# Patient Record
Sex: Male | Born: 1945 | Race: White | Hispanic: No | Marital: Married | Smoking: Never smoker
Health system: Southern US, Community
[De-identification: ages and names within clinical notes are randomized; demographics above are authoritative.]

## PROBLEM LIST (undated history)

## (undated) DIAGNOSIS — R011 Cardiac murmur, unspecified: Secondary | ICD-10-CM

## (undated) DIAGNOSIS — I10 Essential (primary) hypertension: Secondary | ICD-10-CM

## (undated) DIAGNOSIS — I429 Cardiomyopathy, unspecified: Secondary | ICD-10-CM

## (undated) DIAGNOSIS — M1711 Unilateral primary osteoarthritis, right knee: Secondary | ICD-10-CM

## (undated) DIAGNOSIS — I340 Nonrheumatic mitral (valve) insufficiency: Secondary | ICD-10-CM

## (undated) DIAGNOSIS — Z87442 Personal history of urinary calculi: Secondary | ICD-10-CM

## (undated) DIAGNOSIS — K4090 Unilateral inguinal hernia, without obstruction or gangrene, not specified as recurrent: Secondary | ICD-10-CM

## (undated) DIAGNOSIS — M1990 Unspecified osteoarthritis, unspecified site: Secondary | ICD-10-CM

## (undated) DIAGNOSIS — E785 Hyperlipidemia, unspecified: Secondary | ICD-10-CM

## (undated) DIAGNOSIS — R0600 Dyspnea, unspecified: Secondary | ICD-10-CM

## (undated) DIAGNOSIS — I517 Cardiomegaly: Secondary | ICD-10-CM

## (undated) DIAGNOSIS — I499 Cardiac arrhythmia, unspecified: Secondary | ICD-10-CM

## (undated) DIAGNOSIS — I4891 Unspecified atrial fibrillation: Secondary | ICD-10-CM

## (undated) DIAGNOSIS — IMO0001 Reserved for inherently not codable concepts without codable children: Secondary | ICD-10-CM

## (undated) DIAGNOSIS — C61 Malignant neoplasm of prostate: Secondary | ICD-10-CM

## (undated) DIAGNOSIS — I351 Nonrheumatic aortic (valve) insufficiency: Secondary | ICD-10-CM

## (undated) HISTORY — PX: COLONOSCOPY: SHX174

## (undated) HISTORY — PX: LITHOTRIPSY: SUR834

## (undated) HISTORY — PX: APPENDECTOMY: SHX54

## (undated) HISTORY — PX: INGUINAL HERNIA REPAIR: SUR1180

## (undated) HISTORY — PX: JOINT REPLACEMENT: SHX530

---
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
## 2000-08-21 HISTORY — PX: PROSTATE SURGERY: SHX751

## 2015-10-05 DIAGNOSIS — E785 Hyperlipidemia, unspecified: Secondary | ICD-10-CM | POA: Diagnosis not present

## 2015-10-05 DIAGNOSIS — Z1211 Encounter for screening for malignant neoplasm of colon: Secondary | ICD-10-CM | POA: Diagnosis not present

## 2015-10-05 DIAGNOSIS — R222 Localized swelling, mass and lump, trunk: Secondary | ICD-10-CM | POA: Diagnosis not present

## 2015-10-05 DIAGNOSIS — Z0000 Encounter for general adult medical examination without abnormal findings: Secondary | ICD-10-CM | POA: Diagnosis not present

## 2015-10-05 DIAGNOSIS — I10 Essential (primary) hypertension: Secondary | ICD-10-CM | POA: Diagnosis not present

## 2015-10-05 DIAGNOSIS — Z23 Encounter for immunization: Secondary | ICD-10-CM | POA: Diagnosis not present

## 2015-10-06 ENCOUNTER — Other Ambulatory Visit: Payer: Self-pay | Admitting: Family Medicine

## 2015-10-06 DIAGNOSIS — R222 Localized swelling, mass and lump, trunk: Secondary | ICD-10-CM

## 2015-10-18 ENCOUNTER — Ambulatory Visit
Admission: RE | Admit: 2015-10-18 | Discharge: 2015-10-18 | Disposition: A | Discharge status: Home / Self Care | Payer: Self-pay | Source: Ambulatory Visit | Attending: Family Medicine | Admitting: Family Medicine

## 2015-10-18 DIAGNOSIS — R221 Localized swelling, mass and lump, neck: Secondary | ICD-10-CM | POA: Diagnosis not present

## 2015-10-18 DIAGNOSIS — R222 Localized swelling, mass and lump, trunk: Secondary | ICD-10-CM

## 2015-10-20 DIAGNOSIS — Z1211 Encounter for screening for malignant neoplasm of colon: Secondary | ICD-10-CM | POA: Diagnosis not present

## 2015-11-22 DIAGNOSIS — D126 Benign neoplasm of colon, unspecified: Secondary | ICD-10-CM | POA: Diagnosis not present

## 2015-11-22 DIAGNOSIS — D125 Benign neoplasm of sigmoid colon: Secondary | ICD-10-CM | POA: Diagnosis not present

## 2015-11-22 DIAGNOSIS — Z1211 Encounter for screening for malignant neoplasm of colon: Secondary | ICD-10-CM | POA: Diagnosis not present

## 2015-11-22 DIAGNOSIS — K640 First degree hemorrhoids: Secondary | ICD-10-CM | POA: Diagnosis not present

## 2015-11-22 DIAGNOSIS — K621 Rectal polyp: Secondary | ICD-10-CM | POA: Diagnosis not present

## 2015-11-22 DIAGNOSIS — K5730 Diverticulosis of large intestine without perforation or abscess without bleeding: Secondary | ICD-10-CM | POA: Diagnosis not present

## 2015-12-07 DIAGNOSIS — M7541 Impingement syndrome of right shoulder: Secondary | ICD-10-CM | POA: Diagnosis not present

## 2015-12-07 DIAGNOSIS — M25511 Pain in right shoulder: Secondary | ICD-10-CM | POA: Diagnosis not present

## 2015-12-07 DIAGNOSIS — M19011 Primary osteoarthritis, right shoulder: Secondary | ICD-10-CM | POA: Diagnosis not present

## 2016-01-06 DIAGNOSIS — K1329 Other disturbances of oral epithelium, including tongue: Secondary | ICD-10-CM | POA: Diagnosis not present

## 2016-01-12 ENCOUNTER — Encounter (HOSPITAL_COMMUNITY): Payer: Self-pay | Admitting: *Deleted

## 2016-01-12 ENCOUNTER — Other Ambulatory Visit (HOSPITAL_COMMUNITY): Payer: Self-pay | Admitting: *Deleted

## 2016-01-12 MED ORDER — TRANEXAMIC ACID 1000 MG/10ML IV SOLN
1000.0000 mg | INTRAVENOUS | Status: AC
  Administered 2016-01-13: 1000 mg via INTRAVENOUS
  Filled 2016-01-12 (×2): qty 10

## 2016-01-12 NOTE — Progress Notes (Signed)
   01/12/16 1458  OBSTRUCTIVE SLEEP APNEA  Have you ever been diagnosed with sleep apnea through a sleep study? No  Do you snore loudly (loud enough to be heard through closed doors)?  1  Do you often feel tired, fatigued, or sleepy during the daytime (such as falling asleep during driving or talking to someone)? 0  Has anyone observed you stop breathing during your sleep? 1  Do you have, or are you being treated for high blood pressure? 1  Age > 88 (1-yes) 1  Male Gender (Yes=1) 1  Obstructive Sleep Apnea Score 5  Score 5 or greater  Results sent to PCP

## 2016-01-13 ENCOUNTER — Encounter (HOSPITAL_COMMUNITY)
Admission: RE | Disposition: A | Discharge status: Home / Self Care | Payer: Self-pay | Source: Ambulatory Visit | Attending: Orthopedic Surgery

## 2016-01-13 ENCOUNTER — Inpatient Hospital Stay (HOSPITAL_COMMUNITY): Payer: Medicare Other | Admitting: Anesthesiology

## 2016-01-13 ENCOUNTER — Encounter (HOSPITAL_COMMUNITY): Payer: Self-pay | Admitting: *Deleted

## 2016-01-13 ENCOUNTER — Inpatient Hospital Stay (HOSPITAL_COMMUNITY)
Admission: RE | Admit: 2016-01-13 | Discharge: 2016-01-14 | DRG: 483 | Disposition: A | Discharge status: Home / Self Care | Payer: Medicare Other | Source: Ambulatory Visit | Attending: Orthopedic Surgery | Admitting: Orthopedic Surgery

## 2016-01-13 DIAGNOSIS — M19011 Primary osteoarthritis, right shoulder: Secondary | ICD-10-CM | POA: Diagnosis not present

## 2016-01-13 DIAGNOSIS — Z8546 Personal history of malignant neoplasm of prostate: Secondary | ICD-10-CM

## 2016-01-13 DIAGNOSIS — I10 Essential (primary) hypertension: Secondary | ICD-10-CM | POA: Diagnosis present

## 2016-01-13 DIAGNOSIS — Z96619 Presence of unspecified artificial shoulder joint: Secondary | ICD-10-CM

## 2016-01-13 DIAGNOSIS — Z96611 Presence of right artificial shoulder joint: Secondary | ICD-10-CM

## 2016-01-13 DIAGNOSIS — Z96652 Presence of left artificial knee joint: Secondary | ICD-10-CM | POA: Diagnosis not present

## 2016-01-13 DIAGNOSIS — M75101 Unspecified rotator cuff tear or rupture of right shoulder, not specified as traumatic: Secondary | ICD-10-CM | POA: Diagnosis not present

## 2016-01-13 DIAGNOSIS — G8918 Other acute postprocedural pain: Secondary | ICD-10-CM | POA: Diagnosis not present

## 2016-01-13 HISTORY — PX: REVERSE SHOULDER ARTHROPLASTY: SHX5054

## 2016-01-13 HISTORY — DX: Unspecified osteoarthritis, unspecified site: M19.90

## 2016-01-13 HISTORY — DX: Essential (primary) hypertension: I10

## 2016-01-13 LAB — CBC
HCT: 45.2 % (ref 39.0–52.0)
Hemoglobin: 14.6 g/dL (ref 13.0–17.0)
MCH: 29.4 pg (ref 26.0–34.0)
MCHC: 32.3 g/dL (ref 30.0–36.0)
MCV: 91.1 fL (ref 78.0–100.0)
PLATELETS: 195 10*3/uL (ref 150–400)
RBC: 4.96 MIL/uL (ref 4.22–5.81)
RDW: 13.7 % (ref 11.5–15.5)
WBC: 5.8 10*3/uL (ref 4.0–10.5)

## 2016-01-13 LAB — BASIC METABOLIC PANEL
Anion gap: 8 (ref 5–15)
BUN: 26 mg/dL — AB (ref 6–20)
CHLORIDE: 107 mmol/L (ref 101–111)
CO2: 24 mmol/L (ref 22–32)
CREATININE: 1.09 mg/dL (ref 0.61–1.24)
Calcium: 9.2 mg/dL (ref 8.9–10.3)
GFR calc Af Amer: >60 mL/min (ref >60)
GLUCOSE: 109 mg/dL — AB (ref 65–99)
Potassium: 4 mmol/L (ref 3.5–5.1)
SODIUM: 139 mmol/L (ref 135–145)

## 2016-01-13 SURGERY — ARTHROPLASTY, SHOULDER, TOTAL, REVERSE
Anesthesia: General | Site: Shoulder | Laterality: Right

## 2016-01-13 MED ORDER — PHENOL 1.4 % MT LIQD: 1.0000 | OROMUCOSAL | Status: DC | PRN

## 2016-01-13 MED ORDER — ARTIFICIAL TEARS OP OINT
TOPICAL_OINTMENT | OPHTHALMIC | Status: DC | PRN
  Administered 2016-01-13: 1 via OPHTHALMIC

## 2016-01-13 MED ORDER — SUGAMMADEX SODIUM 200 MG/2ML IV SOLN
INTRAVENOUS | Status: DC | PRN
  Administered 2016-01-13: 200 mg via INTRAVENOUS

## 2016-01-13 MED ORDER — PHENYLEPHRINE 40 MCG/ML (10ML) SYRINGE FOR IV PUSH (FOR BLOOD PRESSURE SUPPORT)
PREFILLED_SYRINGE | INTRAVENOUS | Status: AC
  Filled 2016-01-13: qty 10

## 2016-01-13 MED ORDER — LIDOCAINE 2% (20 MG/ML) 5 ML SYRINGE
INTRAMUSCULAR | Status: AC
  Filled 2016-01-13: qty 5

## 2016-01-13 MED ORDER — ONDANSETRON HCL 4 MG PO TABS: 4.0000 mg | ORAL_TABLET | Freq: Four times a day (QID) | ORAL | Status: DC | PRN

## 2016-01-13 MED ORDER — ACETAMINOPHEN 650 MG RE SUPP: 650.0000 mg | Freq: Four times a day (QID) | RECTAL | Status: DC | PRN

## 2016-01-13 MED ORDER — ONDANSETRON HCL 4 MG/2ML IJ SOLN
INTRAMUSCULAR | Status: AC
  Filled 2016-01-13: qty 2

## 2016-01-13 MED ORDER — DOCUSATE SODIUM 100 MG PO CAPS
100.0000 mg | ORAL_CAPSULE | Freq: Two times a day (BID) | ORAL | Status: DC
  Administered 2016-01-14: 100 mg via ORAL
  Filled 2016-01-13: qty 1

## 2016-01-13 MED ORDER — METOCLOPRAMIDE HCL 5 MG/ML IJ SOLN: 5.0000 mg | Freq: Three times a day (TID) | INTRAMUSCULAR | Status: DC | PRN

## 2016-01-13 MED ORDER — BISACODYL 5 MG PO TBEC: 5.0000 mg | DELAYED_RELEASE_TABLET | Freq: Every day | ORAL | Status: DC | PRN

## 2016-01-13 MED ORDER — DEXTROSE 5 % IV SOLN
10.0000 mg | INTRAVENOUS | Status: DC | PRN
  Administered 2016-01-13: 100 ug/min via INTRAVENOUS

## 2016-01-13 MED ORDER — ROCURONIUM BROMIDE 50 MG/5ML IV SOLN
INTRAVENOUS | Status: AC
  Filled 2016-01-13: qty 1

## 2016-01-13 MED ORDER — CEFAZOLIN SODIUM-DEXTROSE 2-4 GM/100ML-% IV SOLN
2.0000 g | INTRAVENOUS | Status: AC
  Administered 2016-01-13: 2 g via INTRAVENOUS
  Filled 2016-01-13: qty 100

## 2016-01-13 MED ORDER — MIDAZOLAM HCL 2 MG/2ML IJ SOLN
INTRAMUSCULAR | Status: AC
  Administered 2016-01-13: 1 mg
  Filled 2016-01-13: qty 2

## 2016-01-13 MED ORDER — FENTANYL CITRATE (PF) 100 MCG/2ML IJ SOLN
INTRAMUSCULAR | Status: DC | PRN
  Administered 2016-01-13 (×2): 100 ug via INTRAVENOUS

## 2016-01-13 MED ORDER — LACTATED RINGERS IV SOLN
INTRAVENOUS | Status: DC | PRN
  Administered 2016-01-13 (×2): via INTRAVENOUS

## 2016-01-13 MED ORDER — LIDOCAINE HCL (CARDIAC) 20 MG/ML IV SOLN
INTRAVENOUS | Status: DC | PRN
  Administered 2016-01-13: 60 mg via INTRAVENOUS

## 2016-01-13 MED ORDER — 0.9 % SODIUM CHLORIDE (POUR BTL) OPTIME
TOPICAL | Status: DC | PRN
  Administered 2016-01-13: 1000 mL

## 2016-01-13 MED ORDER — HYDROMORPHONE HCL 1 MG/ML IJ SOLN: 1.0000 mg | INTRAMUSCULAR | Status: DC | PRN

## 2016-01-13 MED ORDER — GLYCOPYRROLATE 0.2 MG/ML IJ SOLN
INTRAMUSCULAR | Status: DC | PRN
  Administered 2016-01-13 (×2): 0.2 mg via INTRAVENOUS
  Administered 2016-01-13: .2 mg via INTRAVENOUS

## 2016-01-13 MED ORDER — OXYCODONE HCL 5 MG PO TABS
5.0000 mg | ORAL_TABLET | ORAL | Status: DC | PRN
  Administered 2016-01-14 (×3): 10 mg via ORAL
  Filled 2016-01-13 (×3): qty 2

## 2016-01-13 MED ORDER — MENTHOL 3 MG MT LOZG: 1.0000 | LOZENGE | OROMUCOSAL | Status: DC | PRN

## 2016-01-13 MED ORDER — ARTIFICIAL TEARS OP OINT
TOPICAL_OINTMENT | OPHTHALMIC | Status: AC
  Filled 2016-01-13: qty 3.5

## 2016-01-13 MED ORDER — PHENYLEPHRINE HCL 10 MG/ML IJ SOLN
INTRAMUSCULAR | Status: DC | PRN
  Administered 2016-01-13: 80 ug via INTRAVENOUS
  Administered 2016-01-13: 120 ug via INTRAVENOUS
  Administered 2016-01-13: 80 ug via INTRAVENOUS
  Administered 2016-01-13: 120 ug via INTRAVENOUS

## 2016-01-13 MED ORDER — CHLORHEXIDINE GLUCONATE 4 % EX LIQD: 60.0000 mL | Freq: Once | CUTANEOUS | Status: DC

## 2016-01-13 MED ORDER — PROPOFOL 10 MG/ML IV BOLUS
INTRAVENOUS | Status: DC | PRN
  Administered 2016-01-13: 200 mg via INTRAVENOUS

## 2016-01-13 MED ORDER — ONDANSETRON HCL 4 MG/2ML IJ SOLN: 4.0000 mg | Freq: Four times a day (QID) | INTRAMUSCULAR | Status: DC | PRN

## 2016-01-13 MED ORDER — ONDANSETRON HCL 4 MG/2ML IJ SOLN
INTRAMUSCULAR | Status: DC | PRN
  Administered 2016-01-13: 4 mg via INTRAVENOUS

## 2016-01-13 MED ORDER — ALUM & MAG HYDROXIDE-SIMETH 200-200-20 MG/5ML PO SUSP: 30.0000 mL | ORAL | Status: DC | PRN

## 2016-01-13 MED ORDER — METOCLOPRAMIDE HCL 5 MG PO TABS: 5.0000 mg | ORAL_TABLET | Freq: Three times a day (TID) | ORAL | Status: DC | PRN

## 2016-01-13 MED ORDER — FENTANYL CITRATE (PF) 100 MCG/2ML IJ SOLN
INTRAMUSCULAR | Status: AC
  Administered 2016-01-13: 50 ug
  Filled 2016-01-13: qty 2

## 2016-01-13 MED ORDER — ROCURONIUM BROMIDE 100 MG/10ML IV SOLN
INTRAVENOUS | Status: DC | PRN
Start: 2016-01-13 — End: 2016-01-13
  Administered 2016-01-13: 50 mg via INTRAVENOUS

## 2016-01-13 MED ORDER — PROPOFOL 10 MG/ML IV BOLUS
INTRAVENOUS | Status: AC
  Filled 2016-01-13: qty 20

## 2016-01-13 MED ORDER — LACTATED RINGERS IV SOLN
INTRAVENOUS | Status: DC
  Administered 2016-01-13: 75 mL via INTRAVENOUS

## 2016-01-13 MED ORDER — MAGNESIUM CITRATE PO SOLN: 1.0000 | Freq: Once | ORAL | Status: DC | PRN

## 2016-01-13 MED ORDER — FENTANYL CITRATE (PF) 250 MCG/5ML IJ SOLN
INTRAMUSCULAR | Status: AC
  Filled 2016-01-13: qty 5

## 2016-01-13 MED ORDER — BUPIVACAINE-EPINEPHRINE (PF) 0.5% -1:200000 IJ SOLN
INTRAMUSCULAR | Status: DC | PRN
  Administered 2016-01-13: 25 mL via PERINEURAL

## 2016-01-13 MED ORDER — DIAZEPAM 2 MG PO TABS: 2.0000 mg | ORAL_TABLET | Freq: Four times a day (QID) | ORAL | Status: DC | PRN

## 2016-01-13 MED ORDER — GLYCOPYRROLATE 0.2 MG/ML IV SOSY
PREFILLED_SYRINGE | INTRAVENOUS | Status: AC
  Filled 2016-01-13: qty 3

## 2016-01-13 MED ORDER — ACETAMINOPHEN 325 MG PO TABS: 650.0000 mg | ORAL_TABLET | Freq: Four times a day (QID) | ORAL | Status: DC | PRN

## 2016-01-13 MED ORDER — CEFAZOLIN SODIUM-DEXTROSE 2-4 GM/100ML-% IV SOLN
2.0000 g | Freq: Four times a day (QID) | INTRAVENOUS | Status: AC
  Administered 2016-01-13 – 2016-01-14 (×3): 2 g via INTRAVENOUS
  Filled 2016-01-13 (×3): qty 100

## 2016-01-13 MED ORDER — BISOPROLOL-HYDROCHLOROTHIAZIDE 10-6.25 MG PO TABS
1.0000 | ORAL_TABLET | ORAL | Status: DC
  Administered 2016-01-14: 1 via ORAL
  Filled 2016-01-13: qty 1

## 2016-01-13 MED ORDER — LISINOPRIL 20 MG PO TABS
30.0000 mg | ORAL_TABLET | ORAL | Status: DC
  Administered 2016-01-14: 30 mg via ORAL
  Filled 2016-01-13: qty 1

## 2016-01-13 MED ORDER — POLYETHYLENE GLYCOL 3350 17 G PO PACK
17.0000 g | PACK | Freq: Every day | ORAL | Status: DC | PRN
Start: 2016-01-13 — End: 2016-01-14

## 2016-01-13 SURGICAL SUPPLY — 69 items
BASEPLATE GLENOID SHLDR SM (Shoulder) ×2 IMPLANT
BLADE SAW SGTL 83.5X18.5 (BLADE) ×2 IMPLANT
COVER SURGICAL LIGHT HANDLE (MISCELLANEOUS) ×2 IMPLANT
CUP SUT UNIV REVERS 42 NEUT (Shoulder) ×2 IMPLANT
DERMABOND ADVANCED (GAUZE/BANDAGES/DRESSINGS) ×1
DERMABOND ADVANCED .7 DNX12 (GAUZE/BANDAGES/DRESSINGS) ×1 IMPLANT
DRAPE ORTHO SPLIT 77X108 STRL (DRAPES) ×2
DRAPE SURG 17X11 SM STRL (DRAPES) ×2 IMPLANT
DRAPE SURG ORHT 6 SPLT 77X108 (DRAPES) ×2 IMPLANT
DRAPE U-SHAPE 47X51 STRL (DRAPES) ×2 IMPLANT
DRSG AQUACEL AG ADV 3.5X10 (GAUZE/BANDAGES/DRESSINGS) ×2 IMPLANT
DURAPREP 26ML APPLICATOR (WOUND CARE) ×2 IMPLANT
ELECT BLADE 4.0 EZ CLEAN MEGAD (MISCELLANEOUS) ×2
ELECT CAUTERY BLADE 6.4 (BLADE) ×2 IMPLANT
ELECT REM PT RETURN 9FT ADLT (ELECTROSURGICAL) ×2
ELECTRODE BLDE 4.0 EZ CLN MEGD (MISCELLANEOUS) ×1 IMPLANT
ELECTRODE REM PT RTRN 9FT ADLT (ELECTROSURGICAL) ×1 IMPLANT
FACESHIELD WRAPAROUND (MASK) ×6 IMPLANT
GLENOSPHERE UNI REV 42+4 LAT (Shoulder) ×2 IMPLANT
GLOVE BIO SURGEON STRL SZ 6.5 (GLOVE) ×2 IMPLANT
GLOVE BIO SURGEON STRL SZ7.5 (GLOVE) ×2 IMPLANT
GLOVE BIO SURGEON STRL SZ8 (GLOVE) ×2 IMPLANT
GLOVE BIO SURGEON STRL SZ8.5 (GLOVE) ×2 IMPLANT
GLOVE BIOGEL PI IND STRL 7.0 (GLOVE) ×1 IMPLANT
GLOVE BIOGEL PI IND STRL 8.5 (GLOVE) ×1 IMPLANT
GLOVE BIOGEL PI INDICATOR 7.0 (GLOVE) ×1
GLOVE BIOGEL PI INDICATOR 8.5 (GLOVE) ×1
GLOVE EUDERMIC 7 POWDERFREE (GLOVE) ×4 IMPLANT
GLOVE SS BIOGEL STRL SZ 7.5 (GLOVE) ×1 IMPLANT
GLOVE SUPERSENSE BIOGEL SZ 7.5 (GLOVE) ×1
GOWN STRL REUS W/ TWL LRG LVL3 (GOWN DISPOSABLE) ×1 IMPLANT
GOWN STRL REUS W/ TWL XL LVL3 (GOWN DISPOSABLE) ×3 IMPLANT
GOWN STRL REUS W/TWL LRG LVL3 (GOWN DISPOSABLE) ×1
GOWN STRL REUS W/TWL XL LVL3 (GOWN DISPOSABLE) ×3
INSERT HUMERAL L/42+3 IN 42CUP (Shoulder) ×2 IMPLANT
KIT BASIN OR (CUSTOM PROCEDURE TRAY) ×2 IMPLANT
KIT ROOM TURNOVER OR (KITS) ×2 IMPLANT
MANIFOLD NEPTUNE II (INSTRUMENTS) ×2 IMPLANT
NEEDLE 1/2 CIR CATGUT .05X1.09 (NEEDLE) ×2 IMPLANT
NEEDLE HYPO 25GX1X1/2 BEV (NEEDLE) IMPLANT
NS IRRIG 1000ML POUR BTL (IV SOLUTION) ×2 IMPLANT
PACK SHOULDER (CUSTOM PROCEDURE TRAY) ×2 IMPLANT
PAD ARMBOARD 7.5X6 YLW CONV (MISCELLANEOUS) ×4 IMPLANT
PASSER SUT SWANSON 36MM LOOP (INSTRUMENTS) IMPLANT
RESTRAINT HEAD UNIVERSAL NS (MISCELLANEOUS) ×2 IMPLANT
SCREW CENTRAL NONLOCK 6.5X20MM (Shoulder) ×2 IMPLANT
SCREW LOCK PERIPHERAL 30MM (Shoulder) ×2 IMPLANT
SCREW LOCK PERIPHERAL 36MM (Screw) ×2 IMPLANT
SET PIN UNIVERSAL REVERSE (SET/KITS/TRAYS/PACK) ×2 IMPLANT
SLING ARM FOAM STRAP LRG (SOFTGOODS) IMPLANT
SLING S3 LATERAL DISP (MISCELLANEOUS) ×2 IMPLANT
SPONGE LAP 18X18 X RAY DECT (DISPOSABLE) ×2 IMPLANT
SPONGE LAP 4X18 X RAY DECT (DISPOSABLE) ×2 IMPLANT
STEM REVERS UNIVERS SZ7 CAP (Shoulder) ×2 IMPLANT
SUCTION FRAZIER HANDLE 10FR (MISCELLANEOUS) ×1
SUCTION TUBE FRAZIER 10FR DISP (MISCELLANEOUS) ×1 IMPLANT
SUT BONE WAX W31G (SUTURE) IMPLANT
SUT FIBERWIRE #2 38 T-5 BLUE (SUTURE) ×4
SUT MNCRL AB 3-0 PS2 18 (SUTURE) ×2 IMPLANT
SUT MON AB 2-0 CT1 36 (SUTURE) ×2 IMPLANT
SUT VIC AB 1 CT1 27 (SUTURE) ×1
SUT VIC AB 1 CT1 27XBRD ANBCTR (SUTURE) ×1 IMPLANT
SUT VIC AB 2-0 CT1 27 (SUTURE)
SUT VIC AB 2-0 CT1 TAPERPNT 27 (SUTURE) IMPLANT
SUTURE FIBERWR #2 38 T-5 BLUE (SUTURE) ×2 IMPLANT
SYR CONTROL 10ML LL (SYRINGE) IMPLANT
TOWEL OR 17X24 6PK STRL BLUE (TOWEL DISPOSABLE) ×2 IMPLANT
TOWEL OR 17X26 10 PK STRL BLUE (TOWEL DISPOSABLE) ×2 IMPLANT
WATER STERILE IRR 1000ML POUR (IV SOLUTION) ×2 IMPLANT

## 2016-01-13 NOTE — Anesthesia Preprocedure Evaluation (Addendum)
Anesthesia Evaluation  Patient identified by MRN, date of birth, ID band Patient awake    Reviewed: Allergy & Precautions, NPO status , Patient's Chart, lab work & pertinent test results  History of Anesthesia Complications Negative for: history of anesthetic complications  Airway Mallampati: I  TM Distance: >3 FB Neck ROM: Full    Dental  (+) Teeth Intact   Pulmonary neg pulmonary ROS,    breath sounds clear to auscultation       Cardiovascular hypertension,  Rhythm:Regular Rate:Normal     Neuro/Psych negative neurological ROS     GI/Hepatic negative GI ROS, Neg liver ROS,   Endo/Other  negative endocrine ROS  Renal/GU negative Renal ROS     Musculoskeletal  (+) Arthritis ,   Abdominal   Peds  Hematology negative hematology ROS (+)   Anesthesia Other Findings   Reproductive/Obstetrics                            Anesthesia Physical Anesthesia Plan  ASA: II  Anesthesia Plan: General   Post-op Pain Management: GA combined w/ Regional for post-op pain   Induction: Intravenous  Airway Management Planned: Oral ETT  Additional Equipment:   Intra-op Plan:   Post-operative Plan: Extubation in OR  Informed Consent: I have reviewed the patients History and Physical, chart, labs and discussed the procedure including the risks, benefits and alternatives for the proposed anesthesia with the patient or authorized representative who has indicated his/her understanding and acceptance.   Dental advisory given  Plan Discussed with:   Anesthesia Plan Comments:         Anesthesia Quick Evaluation

## 2016-01-13 NOTE — Op Note (Signed)
01/13/2016  11:25 AM  PATIENT:   Rema Jasmine  70 y.o. male  PRE-OPERATIVE DIAGNOSIS:  RIGHT SHOULDER ROTATOR CUFF TEAR ARTHROPATHY  POST-OPERATIVE DIAGNOSIS:  same  PROCEDURE:  R shoulder reverse arthroplasty #7 stem, +3 poly, small baseplate, 42/+4 glenosphere  SURGEON:  Tolbert Matheson, Metta Clines M.D.  ASSISTANTS: Shuford pac   ANESTHESIA:   GET + ISB  EBL: 200  SPECIMEN:  none  Drains: none   PATIENT DISPOSITION:  PACU - hemodynamically stable.    PLAN OF CARE: Admit for overnight observation  Dictation# [id number]   Contact # [phone]

## 2016-01-13 NOTE — Anesthesia Postprocedure Evaluation (Signed)
Anesthesia Post Note  Patient: Ronnie Booth  Procedure(s) Performed: Procedure(s) (LRB): RIGHT REVERSE SHOULDER ARTHROPLASTY (Right)  Patient location during evaluation: PACU Anesthesia Type: General and Regional Level of consciousness: awake and alert Pain management: pain level controlled Vital Signs Assessment: post-procedure vital signs reviewed and stable Respiratory status: spontaneous breathing, nonlabored ventilation, respiratory function stable and patient connected to nasal cannula oxygen Cardiovascular status: blood pressure returned to baseline and stable Postop Assessment: no signs of nausea or vomiting Anesthetic complications: no    Last Vitals:  Filed Vitals:   01/13/16 1356 01/13/16 1409  BP: 106/67   Pulse: 51   Temp:  36.6 C  Resp: 15     Last Pain:  Filed Vitals:   01/13/16 1410  PainSc: 4                  Shakirah Kirkey,JAMES TERRILL

## 2016-01-13 NOTE — Op Note (Signed)
NAMEOPAL, CASEBEER NO.:  [id number]  MEDICAL RECORD NO.:  [id number]  LOCATION:  [facility]                         FACILITY:  [facility]  PHYSICIAN:  Metta Clines. Tasneem Cormier, M.D.  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  PROCEDURE:  Right shoulder reverse arthroplasty utilizing a press-fit size 7 Arthrex stem with a 42 baseplate, a +3 poly, a small glenoid baseplate with a 42 x +4 glenosphere.  SURGEON:  Metta Clines. Mariona Scholes, M.D.  Terrence DupontOlivia Mackie A. Shuford, P.A.-C.  ANESTHESIA:  General endotracheal as well as an interscalene block.  ESTIMATED BLOOD LOSS:  200 mL.  DRAINS:  None.  HISTORY:  Mr. Yamanaka is a 70 year old gentleman who has had chronic and progressively increasing left shoulder pain related to end-stage right shoulder rotator cuff tear arthropathy.  His symptoms have progressively deteriorated to the point where he is now severely restricted with the profound loss of function and extreme pain.  He was brought to the operating room at this time for planned right shoulder reverse arthroplasty.  Preoperatively, I counseled Mr. Jasmin regarding treatment options and potential risks versus benefits thereof.  Possible surgical complications were all reviewed including bleeding, infection, neurovascular injury, persistent pain, loss of motion, anesthetic complication, failure of the implant, possible need for additional surgery.  He understands and accepts and agrees with our planned procedure.  PROCEDURE IN DETAIL:  After undergoing routine preop evaluation, the patient received prophylactic antibiotics.  An interscalene block was established in the holding area by the Anesthesia Department.   Placed supine on the operative table, underwent smooth induction of a general endotracheal anesthesia.  Placed in the beach-chair position and appropriately padded and protected.  The right shoulder girdle region was sterilely prepped and draped in standard fashion.  Time-out was called.  An anterior deltopectoral approach to the right shoulder was made through an 8-cm incision.  Skin flaps were elevated, mobilized. Electrocautery was used for hemostasis.  The cephalic vein was taken laterally with the deltoid.  Adhesions were divided beneath the deltoid and Browne retractor was placed.  Pec major taken medially with the upper centimeter of the tendon tenotomized to enhance exposure.  The conjoined tendon was identified, mobilized and retracted medially.  At this point, we divided the subscapularis away from the anterior aspect of the humeral head and the lesser tuberosity was very scarified, did not show any significant elasticity, so this was released.  We then performed a capsular release from the anteroinferior and inferior aspects of the humeral metaphyseal and humeral neck region allowing [place] to deliver the humeral head from the wound.  At this point, we then performed the humeral head resection at approximately 10 degrees of retroversion using a 135 guide.  Some peripheral osteophytes proximally was then removed with the rongeur.  At this point, we then gained exposure of the glenoid with Doreatha Martin, pitchfork, and snake tongue retractors.  We performed a circumferential labral resection and gaining complete visualization of the entirety of the glenoid.  I placed a central guidepin and then we reamed with our central and peripheral reamer and repeated the sequence until we had an appropriate platform for baseplate.  I did correct some moderate retroversion during this process and then removed all the residual bone debris at the margins of the glenoid.  We were very pleased with the  overall bone preparation. We then seated our small glenoid baseplate.  It was then transfixed with a central compression screw and superior and inferior locking screws, all of which obtained excellent bony purchase and fixation.  We then used our peripheral large reamer to confirm that there was no impinging bone at the margins of the glenoid.  At this point, we then returned our attention back to the proximal humerus where we performed preparation reaming the humeral canal by hand and then broaching the humeral canal up to a size 8.  The size 8 did not completely seat, so we elected to stay with the size 7 stem.  We then prepared the metaphysis of the proximal humerus with the appropriate size reamer.  Once we finished preparation of the humerus, we then returned attention back to the glenoid baseplate and inserted our size 42 glenosphere with the +4 offset and this was inserted into the [facility] taper and impacted.  We confirmed that there was solid fixation and good stability.  At this point, then, we again returned our attention back to the proximal humerus, final humeral stem with a 42 baseplate was then impacted with excellent fit and fixation.  We then performed trial reductions and the +3 poly showed excellent soft tissue balance and good stability.  The trial was removed.  We impacted our +3 poly and confirmed it was properly seated.  Final reduction was performed.  After we had copiously irrigated the joint, we removed all residual blood and bony debris.  A final reduction was performed, which again showed excellent shoulder stability and good soft tissue balance.  At this point, we did confirm the subscapularis was not of appropriate caliber or consistency to use effectively as the repair, so was left tenotomized.  The deltopectoral interval was then closed with a series of figure-of-eight #1 Vicryl sutures.  2-0 Vicryl was used for the subcu layer and intracuticular 3-0 Monocryl  for the skin followed by Dermabond and Aquacel dressing.  Right arm was then placed into a sling and the patient was subsequently awakened, extubated and taken to the recovery room in stable condition.  Jenetta Loges, PA-C was used as an Environmental consultant throughout this case, essential for help with positioning the patient, positioning the extremity, tissue manipulation, implantation of the prosthesis, wound closure, and intraoperative decision making.     Metta Clines. Allianna Beaubien, M.D.     KMS/MEDQ  D:  01/13/2016  T:  01/13/2016  Job:  [id number]

## 2016-01-13 NOTE — Progress Notes (Signed)
Utilization review completed.  

## 2016-01-13 NOTE — H&P (Signed)
Ronnie Booth    Chief Complaint: RIGHT SHOULDER ROTATOR CUFF TEAR ARTHROPATHY HPI: The patient is a 70 y.o. male with end stage right shoulder rotator cuff tear arthropathy  Past Medical History  Diagnosis Date  . Hypertension   . Kidney stones   . Cancer [facility])     prostate cancer  . Arthritis     Past Surgical History  Procedure Laterality Date  . Lithotripsy    . Prostate surgery  2002  . Appendectomy    . Colonoscopy    . Joint replacement Left     knee    Family History  Problem Relation Age of Onset  . Alzheimer's disease Mother   . Parkinson's disease Mother   . Lung cancer Father     Social History:  reports that he has never smoked. He has never used smokeless tobacco. He reports that he drinks alcohol. He reports that he does not use illicit drugs.   Medications Prior to Admission  Medication Sig Dispense Refill  . B Complex-C (SUPER B COMPLEX PO) Take 1 tablet by mouth daily.    . bisoprolol-hydrochlorothiazide (ZIAC) 10-6.25 MG tablet Take 1 tablet by mouth every morning.    . Cholecalciferol (VITAMIN D3) 2000 units TABS Take 1 tablet by mouth daily.    Marland Kitchen lisinopril (PRINIVIL,ZESTRIL) 30 MG tablet Take 30 mg by mouth every morning.    . Magnesium 250 MG TABS Take 1 tablet by mouth daily.    . naproxen sodium (ALEVE) 220 MG tablet Take 440 mg by mouth every morning.    . Omega-3 Fatty Acids (FISH OIL) 1200 MG CAPS Take 1 capsule by mouth daily.    . Potassium Gluconate 550 (90 K) MG TABS Take 1 tablet by mouth daily.       Physical Exam: right shoulder with severely painful and restricted motion as noted at recent office visit  Vitals  Temp:  [97.8 F (36.6 C)] 97.8 F (36.6 C) (05/25 0727) Pulse Rate:  [57] 57 (05/25 0727) Resp:  [20] 20 (05/25 0727) BP: (190)/(98) 190/98 mmHg (05/25 0727) SpO2:  [99 %] 99 % (05/25 0727) Weight:  [87.544 kg (193 lb)] 87.544 kg (193 lb) (05/25 0727)  Assessment/Plan  Impression: RIGHT SHOULDER ROTATOR  CUFF TEAR ARTHROPATHY  Plan of Action: Procedure(s): RIGHT REVERSE SHOULDER ARTHROPLASTY  Helmi Hechavarria M Lavada Langsam 01/13/2016, 8:53 AM Contact # [phone]

## 2016-01-13 NOTE — Transfer of Care (Signed)
Immediate Anesthesia Transfer of Care Note  Patient: Ronnie Booth  Procedure(s) Performed: Procedure(s): RIGHT REVERSE SHOULDER ARTHROPLASTY (Right)  Patient Location: PACU  Anesthesia Type:General and Regional  Level of Consciousness: awake, alert  and patient cooperative  Airway & Oxygen Therapy: Patient Spontanous Breathing and Patient connected to face mask oxygen  Post-op Assessment: Report given to RN, Post -op Vital signs reviewed and stable, Patient moving all extremities X 4 and Patient able to stick tongue midline  Post vital signs: Reviewed and stable  Last Vitals:  Filed Vitals:   01/13/16 0850 01/13/16 0855  BP:  152/71  Pulse: 47 61  Temp:    Resp: 8 15    Last Pain:  Filed Vitals:   01/13/16 1142  PainSc: 4       Patients Stated Pain Goal: 7 ([phone])  Complications: No apparent anesthesia complications

## 2016-01-13 NOTE — Anesthesia Procedure Notes (Addendum)
Anesthesia Regional Block:  Interscalene brachial plexus block  Pre-Anesthetic Checklist: ,, timeout performed, Correct Patient, Correct Site, Correct Laterality, Correct Procedure, Correct Position, site marked, Risks and benefits discussed,  Surgical consent,  Pre-op evaluation,  At surgeon's request and post-op pain management  Laterality: Upper and Right  Prep: chloraprep and alcohol swabs       Needles:  Injection technique: Single-shot  Needle Type: Echogenic Stimulator Needle     Needle Length: 4cm 4 cm Needle Gauge: 22 and 22 G  Needle insertion depth: 3 cm   Additional Needles:  Procedures: ultrasound guided (picture in chart) and nerve stimulator Interscalene brachial plexus block  Nerve Stimulator or Paresthesia:  Response: Twitch elicited, 0.5 mA, 0.3 ms,   Additional Responses:   Narrative:  Start time: 01/13/2016 8:35 AM End time: 01/13/2016 8:50 AM Injection made incrementally with aspirations every 5 mL.  Performed by: Personally  Anesthesiologist: MASSAGEE, TERRY  Additional Notes: Block assessed prior to start of surgery   Procedure Name: Intubation Date/Time: 01/13/2016 9:48 AM Performed by: Collier Bullock Pre-anesthesia Checklist: Patient identified, Emergency Drugs available, Suction available and Patient being monitored Patient Re-evaluated:Patient Re-evaluated prior to inductionOxygen Delivery Method: Circle system utilized Preoxygenation: Pre-oxygenation with 100% oxygen Intubation Type: IV induction Ventilation: Oral airway inserted - appropriate to patient size Laryngoscope Size: Mac and 3 Grade View: Grade I Tube type: Oral Tube size: 7.0 mm Number of attempts: 1 Airway Equipment and Method: Stylet Placement Confirmation: ETT inserted through vocal cords under direct vision,  breath sounds checked- equal and bilateral and positive ETCO2 Secured at: 24 cm Dental Injury: Teeth and Oropharynx as per pre-operative assessment

## 2016-01-14 MED ORDER — ONDANSETRON HCL 4 MG PO TABS: 4.0000 mg | ORAL_TABLET | Freq: Three times a day (TID) | ORAL | Status: DC | PRN

## 2016-01-14 MED ORDER — OXYCODONE-ACETAMINOPHEN 5-325 MG PO TABS: 1.0000 | ORAL_TABLET | ORAL | Status: DC | PRN

## 2016-01-14 NOTE — Progress Notes (Signed)
OT Evaluation/Treatment  Pt seen for 2 session to complete education regarding ADL management and HEP for RUE per protocol. Pt to follow up with Dr. Onnie Graham to progress rehab of R shoulder.    01/14/16 1000  OT Visit Information  Last OT Received On 01/14/16  Assistance Needed +1  History of Present Illness s/p reverse TSA  Precautions  Precautions Shoulder  Type of Shoulder Precautions active; FF 0-90; Abd 0-60; no IR  Precaution Booklet Issued Yes (comment)  Required Braces or Orthoses Sling  Restrictions  RLE Weight Bearing NWB  Home Living  Family/patient expects to be discharged to: Private residence  Living Arrangements Spouse/significant other  Available Help at Discharge Family;Available 24 hours/day  Type of Home House  Home Access Stairs to enter  Entrance Stairs-Number of Steps 1  Home Layout One level  Bathroom Shower/Tub Walk-in shower  Bathroom Toilet Handicapped height  Bathroom Accessibility No  Home Equipment None  Prior Function  Level of Independence Independent  Communication  Communication No difficulties  Pain Assessment  Pain Assessment 0-10  Pain Score 3  Pain Location R shoulder  Pain Descriptors / Indicators Aching  Pain Intervention(s) Limited activity within patient's tolerance;Repositioned;Ice applied  Cognition  Arousal/Alertness Awake/alert  Behavior During Therapy WFL for tasks assessed/performed  Overall Cognitive Status Within Functional Limits for tasks assessed  Upper Extremity Assessment  Upper Extremity Assessment RUE deficits/detail;LUE deficits/detail  RUE Deficits / Details s/p surgery. able to achieve 0-60 FF; 0-45 Abd; ER to neutral ; elbow/wrist/hand ROM WFL  RUE Coordination decreased gross motor  LUE Deficits / Details apparent RTC insufficiency  Cervical / Trunk Assessment  Cervical / Trunk Assessment Normal  ADL  Overall ADL's  Needs assistance/impaired  Functional mobility during ADLs Supervision/safety  General ADL  Comments Began education on compensatory techniques for ADL. wife educated during second session and verbalized understanding. See shoulder section below.  Bed Mobility  Overal bed mobility Modified Independent  Transfers  Overall transfer level Needs assistance  Transfers Sit to/from Stand  Sit to Stand Min guard  General transfer comment minguard due to immobility and medication  Balance  Overall balance assessment Needs assistance  Standing balance-Leahy Scale Fair  Exercises  Exercises Shoulder  Shoulder Instructions  Donning/doffing shirt without moving shoulder Caregiver independent with task;Patient able to independently direct caregiver  Method for sponge bathing under operated UE Caregiver independent with task;Patient able to independently direct caregiver  Donning/doffing sling/immobilizer Caregiver independent with task;Patient able to independently direct caregiver  Correct positioning of sling/immobilizer Caregiver independent with task;Patient able to independently direct caregiver  ROM for elbow, wrist and digits of operated UE Independent  Sling wearing schedule (on at all times/off for ADL's) Independent  Proper positioning of operated UE when showering Independent  Positioning of UE while sleeping Independent  Shoulder Exercises  Shoulder Flexion Right;10 reps;AAROM;Supine  Shoulder ABduction Right;AAROM;10 reps;Seated  Shoulder External Rotation AAROM;Right;10 reps;Supine  Elbow Flexion AROM;Right;10 reps  Elbow Extension AROM;Right;5 reps  Wrist Flexion AROM;Right;10 reps  Wrist Extension AROM;Right;10 reps  Digit Composite Flexion AROM;Right;10 reps  Composite Extension AROM;Right;10 reps  OT - End of Session  Activity Tolerance Patient tolerated treatment well  Patient left in chair;with call bell/phone within reach  Nurse Communication Mobility status  OT Assessment  OT Therapy Diagnosis  Generalized weakness;Acute pain  OT Recommendation/Assessment  Patient needs continued OT Services;Progress rehab of shoulder as ordered by MD at follow-up appointment  OT Problem List Decreased strength;Decreased range of motion;Decreased knowledge of use of DME  or AE;Impaired UE functional use;Pain  OT Plan  OT Frequency (ACUTE ONLY) Min 2X/week  OT Treatment/Interventions (ACUTE ONLY) Self-care/ADL training;Therapeutic exercise;DME and/or AE instruction;Therapeutic activities;Patient/family education  OT Recommendation  Follow Up Recommendations Supervision - Intermittent  OT Equipment None recommended by OT  Individuals Consulted  Consulted and Agree with Results and Recommendations Patient;Family member/caregiver  Family Member Consulted wife  Acute Rehab OT Goals  Patient Stated Goal to go home  OT Goal Formulation With patient  OT Time Calculation  OT Start Time (ACUTE ONLY) 1015 [phone] (2nd visit)   OT Stop Time (ACUTE ONLY) 1049  OT Time Calculation (min) 34 min 31 min (2nd session)  OT General Charges  $OT Visit (2 visits)  OT Evaluation  $OT Eval Moderate Complexity 1 Procedure  OT Treatments  $Self Care/Home Management  8-22 mins  $Therapeutic Exercise 23-37 mins  Written Expression  Dominant Hand Right  [facility], OTR/L  [phone] 01/14/2016

## 2016-01-14 NOTE — Discharge Summary (Signed)
PATIENT ID:      Ronnie Booth  MRN:     [id number] DOB/AGE:    1946-07-27 / 70 y.o.     DISCHARGE SUMMARY  ADMISSION DATE:    01/13/2016 DISCHARGE DATE:    ADMISSION DIAGNOSIS: RIGHT SHOULDER ROTATOR CUFF TEAR ARTHROPATHY Past Medical History  Diagnosis Date  . Hypertension   . Kidney stones   . Cancer [facility])     prostate cancer  . Arthritis     DISCHARGE DIAGNOSIS:   Active Problems:   S/p reverse total shoulder arthroplasty   PROCEDURE: Procedure(s): RIGHT REVERSE SHOULDER ARTHROPLASTY on 01/13/2016  CONSULTS:     HISTORY:  See H&P in chart.  HOSPITAL COURSE:  Ronnie Booth is a 70 y.o. admitted on 01/13/2016 with a diagnosis of RIGHT SHOULDER ROTATOR CUFF TEAR ARTHROPATHY.  They were brought to the operating room on 01/13/2016 and underwent Procedure(s): RIGHT REVERSE SHOULDER ARTHROPLASTY.    They were given perioperative antibiotics: Anti-infectives    Start     Dose/Rate Route Frequency Ordered Stop   01/13/16 1600  ceFAZolin (ANCEF) IVPB 2g/100 mL premix     2 g 200 mL/hr over 30 Minutes Intravenous Every 6 hours 01/13/16 1421 01/14/16 0543   01/13/16 0713  ceFAZolin (ANCEF) IVPB 2g/100 mL premix     2 g 200 mL/hr over 30 Minutes Intravenous On call to O.R. 01/13/16 [id number] 01/13/16 0954    .  Patient underwent the above named procedure and tolerated it well. The following day they were hemodynamically stable and pain was controlled on oral analgesics. They were neurovascularly intact to the operative extremity. OT was ordered and worked with patient per protocol. They were medically and orthopaedically stable for discharge on day1.    DIAGNOSTIC STUDIES:  RECENT RADIOGRAPHIC STUDIES :  No results found.  RECENT VITAL SIGNS:  Patient Vitals for the past 24 hrs:  BP Temp Temp src Pulse Resp SpO2  01/14/16 0633 (!) 113/59 mmHg 98.5 F (36.9 C) Oral (!) 57 - 91 %  01/14/16 0209 119/69 mmHg 98.5 F (36.9 C) Axillary 79 - 92 %  01/13/16 2213 130/76  mmHg 98.3 F (36.8 C) - 77 17 94 %  01/13/16 1500 117/71 mmHg 97.6 F (36.4 C) - 97 16 94 %  01/13/16 1409 - 97.8 F (36.6 C) - - - -  01/13/16 1356 106/67 mmHg - - (!) 51 15 94 %  01/13/16 1341 112/68 mmHg - - (!) 50 14 95 %  01/13/16 1326 110/76 mmHg - - (!) 49 13 96 %  01/13/16 1311 116/74 mmHg - - (!) 54 15 93 %  01/13/16 1256 117/75 mmHg - - 60 15 92 %  01/13/16 1242 (!) 149/82 mmHg - - 66 13 93 %  01/13/16 1227 124/79 mmHg - - 68 18 96 %  01/13/16 1211 122/81 mmHg - - 61 12 93 %  01/13/16 1156 124/80 mmHg - - 71 16 94 %  01/13/16 1142 125/83 mmHg - - 76 15 97 %  01/13/16 1141 - 97.7 F (36.5 C) - - - -  .  RECENT EKG RESULTS:    Orders placed or performed during the hospital encounter of 01/13/16  . EKG 12-Lead  . EKG 12-Lead    DISCHARGE INSTRUCTIONS:  Discharge Instructions    Discontinue IV    Complete by:  As directed            DISCHARGE MEDICATIONS:     Medication List  TAKE these medications        ALEVE 220 MG tablet  Generic drug:  naproxen sodium  Take 440 mg by mouth every morning.     bisoprolol-hydrochlorothiazide 10-6.25 MG tablet  Commonly known as:  ZIAC  Take 1 tablet by mouth every morning.     Fish Oil 1200 MG Caps  Take 1 capsule by mouth daily.     lisinopril 30 MG tablet  Commonly known as:  PRINIVIL,ZESTRIL  Take 30 mg by mouth every morning.     Magnesium 250 MG Tabs  Take 1 tablet by mouth daily.     ondansetron 4 MG tablet  Commonly known as:  ZOFRAN  Take 1 tablet (4 mg total) by mouth every 8 (eight) hours as needed for nausea or vomiting.     oxyCODONE-acetaminophen 5-325 MG tablet  Commonly known as:  PERCOCET  Take 1-2 tablets by mouth every 4 (four) hours as needed.     Potassium Gluconate 550 (90 K) MG Tabs  Take 1 tablet by mouth daily.     SUPER B COMPLEX PO  Take 1 tablet by mouth daily.     Vitamin D3 2000 units Tabs  Take 1 tablet by mouth daily.        FOLLOW UP VISIT:       Follow-up  Information    Follow up with Metta Clines SUPPLE, MD.   Specialty:  Orthopedic Surgery   Why:  call to be seen in 10-14 days   Contact information:   [address] [place] [address] [id number]       DISCHARGE TO: Home   DISPOSITION: Good  DISCHARGE CONDITION:  Festus Barren for Dr. Justice Britain 01/14/2016, 9:46 AM

## 2016-01-14 NOTE — Discharge Instructions (Signed)

## 2016-01-14 NOTE — Progress Notes (Signed)
Orthopedic Tech Progress Note Patient Details:  Ronnie Booth 08/18/1946 [id number]  Ortho Devices Type of Ortho Device: Arm sling Ortho Device/Splint Interventions: Application   [place] Pink 01/14/2016, 12:44 PM

## 2016-01-17 ENCOUNTER — Encounter (HOSPITAL_COMMUNITY): Payer: Self-pay | Admitting: Orthopedic Surgery

## 2016-01-25 DIAGNOSIS — Z96611 Presence of right artificial shoulder joint: Secondary | ICD-10-CM | POA: Diagnosis not present

## 2016-01-25 DIAGNOSIS — Z471 Aftercare following joint replacement surgery: Secondary | ICD-10-CM | POA: Diagnosis not present

## 2016-02-07 DIAGNOSIS — M19011 Primary osteoarthritis, right shoulder: Secondary | ICD-10-CM | POA: Diagnosis not present

## 2016-02-08 DIAGNOSIS — M19011 Primary osteoarthritis, right shoulder: Secondary | ICD-10-CM | POA: Diagnosis not present

## 2016-02-17 DIAGNOSIS — M19011 Primary osteoarthritis, right shoulder: Secondary | ICD-10-CM | POA: Diagnosis not present

## 2016-02-23 DIAGNOSIS — M21371 Foot drop, right foot: Secondary | ICD-10-CM | POA: Diagnosis not present

## 2016-02-23 DIAGNOSIS — M19011 Primary osteoarthritis, right shoulder: Secondary | ICD-10-CM | POA: Diagnosis not present

## 2016-03-01 DIAGNOSIS — M19011 Primary osteoarthritis, right shoulder: Secondary | ICD-10-CM | POA: Diagnosis not present

## 2016-03-03 DIAGNOSIS — M21371 Foot drop, right foot: Secondary | ICD-10-CM | POA: Diagnosis not present

## 2016-03-03 DIAGNOSIS — M19011 Primary osteoarthritis, right shoulder: Secondary | ICD-10-CM | POA: Diagnosis not present

## 2016-03-06 DIAGNOSIS — M19011 Primary osteoarthritis, right shoulder: Secondary | ICD-10-CM | POA: Diagnosis not present

## 2016-03-06 DIAGNOSIS — M21371 Foot drop, right foot: Secondary | ICD-10-CM | POA: Diagnosis not present

## 2016-03-10 DIAGNOSIS — M19011 Primary osteoarthritis, right shoulder: Secondary | ICD-10-CM | POA: Diagnosis not present

## 2016-03-10 DIAGNOSIS — M21371 Foot drop, right foot: Secondary | ICD-10-CM | POA: Diagnosis not present

## 2016-03-13 DIAGNOSIS — M19011 Primary osteoarthritis, right shoulder: Secondary | ICD-10-CM | POA: Diagnosis not present

## 2016-03-17 DIAGNOSIS — M19011 Primary osteoarthritis, right shoulder: Secondary | ICD-10-CM | POA: Diagnosis not present

## 2016-03-22 DIAGNOSIS — Z471 Aftercare following joint replacement surgery: Secondary | ICD-10-CM | POA: Diagnosis not present

## 2016-03-22 DIAGNOSIS — Z96611 Presence of right artificial shoulder joint: Secondary | ICD-10-CM | POA: Diagnosis not present

## 2016-03-31 DIAGNOSIS — I10 Essential (primary) hypertension: Secondary | ICD-10-CM | POA: Diagnosis not present

## 2016-03-31 DIAGNOSIS — M21371 Foot drop, right foot: Secondary | ICD-10-CM | POA: Diagnosis not present

## 2016-03-31 DIAGNOSIS — Z23 Encounter for immunization: Secondary | ICD-10-CM | POA: Diagnosis not present

## 2016-04-14 DIAGNOSIS — R739 Hyperglycemia, unspecified: Secondary | ICD-10-CM | POA: Diagnosis not present

## 2016-05-16 DIAGNOSIS — M21371 Foot drop, right foot: Secondary | ICD-10-CM | POA: Diagnosis not present

## 2016-05-16 DIAGNOSIS — I10 Essential (primary) hypertension: Secondary | ICD-10-CM | POA: Diagnosis not present

## 2016-06-13 DIAGNOSIS — Z23 Encounter for immunization: Secondary | ICD-10-CM | POA: Diagnosis not present

## 2016-10-04 DIAGNOSIS — I10 Essential (primary) hypertension: Secondary | ICD-10-CM | POA: Diagnosis not present

## 2016-10-04 DIAGNOSIS — E785 Hyperlipidemia, unspecified: Secondary | ICD-10-CM | POA: Diagnosis not present

## 2017-01-09 DIAGNOSIS — I4891 Unspecified atrial fibrillation: Secondary | ICD-10-CM | POA: Diagnosis not present

## 2017-01-09 DIAGNOSIS — R0602 Shortness of breath: Secondary | ICD-10-CM | POA: Diagnosis not present

## 2017-01-16 DIAGNOSIS — I4891 Unspecified atrial fibrillation: Secondary | ICD-10-CM | POA: Diagnosis not present

## 2017-01-17 ENCOUNTER — Telehealth: Payer: Self-pay | Admitting: *Deleted

## 2017-01-17 ENCOUNTER — Encounter: Payer: Self-pay | Admitting: Cardiology

## 2017-01-17 NOTE — Telephone Encounter (Signed)
NOTES FAXED TO [facility] °

## 2017-01-29 ENCOUNTER — Telehealth: Payer: Self-pay | Admitting: Cardiology

## 2017-01-29 NOTE — Telephone Encounter (Signed)
01/29/2017 Received faxed referral packet from [place] for upcoming appointment with Dr. Percival Spanish on 03/02/2017 at 8:40 am.  Records given to [facility].  cbr

## 2017-02-18 DIAGNOSIS — R0600 Dyspnea, unspecified: Secondary | ICD-10-CM

## 2017-02-18 HISTORY — DX: Dyspnea, unspecified: R06.00

## 2017-03-01 NOTE — Progress Notes (Signed)
Cardiology Office Note   Date:  03/04/2017   ID:  [name], DOB Feb 29, 1946, MRN [id number]  PCP:  Orpah Melter, MD  Cardiologist:   Minus Breeding, MD  Referring:  Orpah Melter, MD  Chief Complaint  Patient presents with  . Atrial Fibrillation      History of Present Illness: Ronnie Booth is a 71 y.o. male who presents for evaluation of SOB.   He presented with this complaint to his primary provider in May. He was found to be in atrial fibrillation and started on Cardizem. His lisinopril was decreased. He was started on Xarelto as well. He describes the shortness of breath as a feeling like he doesn't take a deep breath. He doesn't describe any dyspnea with exertion. She doesn't describe any PND or orthopnea. He has no palpitations, presyncope or syncope. He wouldn't know that he was in fibrillation. He simply has a sense that he has to sigh to get a breath and it is not a consistent thing. 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 He's tolerated the Xarelto and has had no bleeding issues.     Past Medical History:  Diagnosis Date  . Arthritis   . Cancer [facility])    prostate cancer  . Hypertension   . Kidney stones     Past Surgical History:  Procedure Laterality Date  . APPENDECTOMY    . COLONOSCOPY    . JOINT REPLACEMENT Left    knee  . LITHOTRIPSY    . PROSTATE SURGERY  2002  . REVERSE SHOULDER ARTHROPLASTY Right 01/13/2016   Procedure: RIGHT REVERSE SHOULDER ARTHROPLASTY;  Surgeon: Justice Britain, MD;  Location: [facility];  Service: Orthopedics;  Laterality: Right;     Current Outpatient Prescriptions  Medication Sig Dispense Refill  . B Complex-C (SUPER B COMPLEX PO) Take 1 tablet by mouth daily.    . bisoprolol-hydrochlorothiazide (ZIAC) 10-6.25 MG tablet Take 1 tablet by mouth every morning.    . Cholecalciferol (VITAMIN D3) 2000 units TABS Take 1 tablet by mouth daily.    . Magnesium 250 MG TABS Take 1 tablet by  mouth daily.    . naproxen sodium (ALEVE) 220 MG tablet Take 440 mg by mouth every morning.    . Omega-3 Fatty Acids (FISH OIL) 1200 MG CAPS Take 1 capsule by mouth daily.    . ondansetron (ZOFRAN) 4 MG tablet Take 1 tablet (4 mg total) by mouth every 8 (eight) hours as needed for nausea or vomiting. 20 tablet 0  . oxyCODONE-acetaminophen (PERCOCET) 5-325 MG tablet Take 1-2 tablets by mouth every 4 (four) hours as needed. 60 tablet 0  . Potassium Gluconate 550 (90 K) MG TABS Take 1 tablet by mouth daily.    Marland Kitchen lisinopril (PRINIVIL,ZESTRIL) 40 MG tablet Take 1 tablet (40 mg total) by mouth daily. 90 tablet 3   No current facility-administered medications for this visit.     Allergies:   Patient has no known allergies.    Social History:  The patient  reports that he has never smoked. He has never used smokeless tobacco. He reports that he drinks alcohol. He reports that he does not use drugs.   Family History:  The patient's family history includes Alzheimer's disease in his mother; Lung cancer in his father; Parkinson's disease in his mother.    ROS:  Please see the history of present illness.   Otherwise, review of systems are positive for none.  All other systems are reviewed and negative.    PHYSICAL EXAM: VS:  BP (!) 170/110   Pulse 98   Ht 6' 1.5" (1.867 m)   Wt 187 lb 12.8 oz (85.2 kg)   BMI 24.44 kg/m  , BMI Body mass index is 24.44 kg/m. GENERAL:  Well appearing HEENT:  Pupils equal round and reactive, fundi not visualized, oral mucosa unremarkable NECK:  No jugular venous distention, waveform within normal limits, carotid upstroke brisk and symmetric, no bruits, no thyromegaly LYMPHATICS:  No cervical, inguinal adenopathy LUNGS:  Clear to auscultation bilaterally BACK:  No CVA tenderness CHEST:  Unremarkable HEART:  PMI not displaced or sustained,S1 and S2 within normal limits, no S3, no clicks, no rubs, no murmurs, irregular ABD:  Flat, positive bowel sounds normal in  frequency in pitch, no bruits, no rebound, no guarding, no midline pulsatile mass, no hepatomegaly, no splenomegaly EXT:  2 plus pulses throughout, no edema, no cyanosis no clubbing SKIN:  No rashes no nodules NEURO:  Cranial nerves II through XII grossly intact, motor grossly intact throughout PSYCH:  Cognitively intact, oriented to person place and time    EKG:  EKG is ordered today. The ekg ordered today demonstrates atrial fibrillation, rate 98, axis within normal limits, intervals within normal limits, no acute ST-T wave changes.   Recent Labs: No results found for requested labs within last 8760 hours.    Lipid Panel No results found for: CHOL, TRIG, HDL, CHOLHDL, VLDL, LDLCALC, LDLDIRECT    Wt Readings from Last 3 Encounters:  03/02/17 187 lb 12.8 oz (85.2 kg)  01/13/16 193 lb (87.5 kg)      Other studies Reviewed: Additional studies/ records that were reviewed today include: Office records. Review of the above records demonstrates:  Please see elsewhere in the note.     ASSESSMENT AND PLAN:    ATRIAL FIB:  Mr. [name] has a CHA2DS2 - VASc score of 2.  He has some vague symptoms that might be related to the fibrillation. I will discuss the risks and benefits of cardioversion with him and will proceed with this. He tolerates his anticoagulation and has been taking this uninterrupted.  I will check CBC and BMET.  I will check an echo.  Of note I believe to be persistent not paroxysmal as it has been evident on repeat EKGs. Also did note that his TSH was normal as were other electrolytes.  SOB:  This will be evaluated with the echo above.    HTN:  I will increase the lisinopril to 40 mg daily.      Current medicines are reviewed at length with the patient today.  The patient does not have concerns regarding medicines.  The following changes have been made:  no change  Labs/ tests ordered today include:   Orders Placed This Encounter  Procedures  .  ELECTRICAL CARDIOVERSION  . CBC  . Comprehensive Metabolic Panel (CMET)  . TSH  . INR/PT  . APTT  . ECHOCARDIOGRAM COMPLETE     Disposition:   FU with me after the DCCV.      Signed, Minus Breeding, MD  03/04/2017 5:58 PM    [facility]

## 2017-03-02 ENCOUNTER — Ambulatory Visit (INDEPENDENT_AMBULATORY_CARE_PROVIDER_SITE_OTHER): Payer: Medicare Other | Admitting: Cardiology

## 2017-03-02 ENCOUNTER — Encounter: Payer: Self-pay | Admitting: Cardiology

## 2017-03-02 VITALS — BP 170/110 | HR 98 | Ht 73.5 in | Wt 187.8 lb

## 2017-03-02 DIAGNOSIS — I10 Essential (primary) hypertension: Secondary | ICD-10-CM

## 2017-03-02 DIAGNOSIS — R0602 Shortness of breath: Secondary | ICD-10-CM

## 2017-03-02 DIAGNOSIS — D689 Coagulation defect, unspecified: Secondary | ICD-10-CM | POA: Diagnosis not present

## 2017-03-02 DIAGNOSIS — I4891 Unspecified atrial fibrillation: Secondary | ICD-10-CM

## 2017-03-02 DIAGNOSIS — Z01812 Encounter for preprocedural laboratory examination: Secondary | ICD-10-CM

## 2017-03-02 MED ORDER — LISINOPRIL 40 MG PO TABS: 40.0000 mg | ORAL_TABLET | Freq: Every day | ORAL | 3 refills | Status: DC

## 2017-03-02 NOTE — Patient Instructions (Addendum)
Medication Instructions:  INCREASE- Lisinopril 40 mg daily  Labwork: Pre Op Labs  Testing/Procedures: Your physician has requested that you have an echocardiogram. Echocardiography is a painless test that uses sound waves to create images of your heart. It provides your doctor with information about the size and shape of your heart and how well your heart's chambers and valves are working. This procedure takes approximately one hour. There are no restrictions for this procedure.  Your physician has recommended that you have a Cardioversion (DCCV). Electrical Cardioversion uses a jolt of electricity to your heart either through paddles or wired patches attached to your chest. This is a controlled, usually prescheduled, procedure. Defibrillation is done under light anesthesia in the hospital, and you usually go home the day of the procedure. This is done to get your heart back into a normal rhythm. You are not awake for the procedure. Please see the instruction sheet given to you today.   Follow-Up: Your physician recommends that you schedule a follow-up appointment in: After Test   Any Other Special Instructions Will Be Listed Below (If Applicable).   If you need a refill on your cardiac medications before your next appointment, please call your pharmacy.

## 2017-03-04 ENCOUNTER — Encounter: Payer: Self-pay | Admitting: Cardiology

## 2017-03-04 DIAGNOSIS — I10 Essential (primary) hypertension: Secondary | ICD-10-CM | POA: Insufficient documentation

## 2017-03-04 DIAGNOSIS — R0602 Shortness of breath: Secondary | ICD-10-CM | POA: Insufficient documentation

## 2017-03-04 DIAGNOSIS — I4891 Unspecified atrial fibrillation: Secondary | ICD-10-CM | POA: Insufficient documentation

## 2017-03-05 NOTE — Addendum Note (Signed)
Addended by: Zebedee Iba on: 03/05/2017 02:21 PM   Modules accepted: Orders

## 2017-03-06 DIAGNOSIS — M1711 Unilateral primary osteoarthritis, right knee: Secondary | ICD-10-CM | POA: Diagnosis not present

## 2017-03-09 ENCOUNTER — Ambulatory Visit (HOSPITAL_COMMUNITY): Payer: Medicare Other | Attending: Cardiovascular Disease

## 2017-03-09 ENCOUNTER — Other Ambulatory Visit: Payer: Self-pay

## 2017-03-09 DIAGNOSIS — I4891 Unspecified atrial fibrillation: Secondary | ICD-10-CM | POA: Insufficient documentation

## 2017-03-09 DIAGNOSIS — D689 Coagulation defect, unspecified: Secondary | ICD-10-CM | POA: Diagnosis not present

## 2017-03-09 DIAGNOSIS — Z01812 Encounter for preprocedural laboratory examination: Secondary | ICD-10-CM | POA: Diagnosis not present

## 2017-03-09 LAB — PROTIME-INR
INR: 1.3 — ABNORMAL HIGH (ref 0.8–1.2)
PROTHROMBIN TIME: 13.4 s — AB (ref 9.1–12.0)

## 2017-03-09 LAB — COMPREHENSIVE METABOLIC PANEL
A/G RATIO: 1.9 (ref 1.2–2.2)
ALT: 30 IU/L (ref 0–44)
AST: 24 IU/L (ref 0–40)
Albumin: 4.3 g/dL (ref 3.5–4.8)
Alkaline Phosphatase: 61 IU/L (ref 39–117)
BILIRUBIN TOTAL: 0.8 mg/dL (ref 0.0–1.2)
BUN/Creatinine Ratio: 29 — ABNORMAL HIGH (ref 10–24)
BUN: 34 mg/dL — ABNORMAL HIGH (ref 8–27)
CHLORIDE: 99 mmol/L (ref 96–106)
CO2: 24 mmol/L (ref 20–29)
Calcium: 9.4 mg/dL (ref 8.6–10.2)
Creatinine, Ser: 1.18 mg/dL (ref 0.76–1.27)
GFR calc non Af Amer: 61 mL/min/{1.73_m2} (ref >59)
GFR, EST AFRICAN AMERICAN: 71 mL/min/{1.73_m2} (ref >59)
Globulin, Total: 2.3 g/dL (ref 1.5–4.5)
Glucose: 98 mg/dL (ref 65–99)
POTASSIUM: 4.9 mmol/L (ref 3.5–5.2)
Sodium: 140 mmol/L (ref 134–144)
TOTAL PROTEIN: 6.6 g/dL (ref 6.0–8.5)

## 2017-03-09 LAB — CBC
Hematocrit: 46.5 % (ref 37.5–51.0)
Hemoglobin: 16 g/dL (ref 13.0–17.7)
MCH: 30.8 pg (ref 26.6–33.0)
MCHC: 34.4 g/dL (ref 31.5–35.7)
MCV: 89 fL (ref 79–97)
PLATELETS: 232 10*3/uL (ref 150–379)
RBC: 5.2 x10E6/uL (ref 4.14–5.80)
RDW: 14.8 % (ref 12.3–15.4)
WBC: 8 10*3/uL (ref 3.4–10.8)

## 2017-03-09 LAB — APTT: aPTT: 34 s — ABNORMAL HIGH (ref 24–33)

## 2017-03-09 LAB — TSH: TSH: 1.73 u[IU]/mL (ref 0.450–4.500)

## 2017-03-12 ENCOUNTER — Encounter (HOSPITAL_COMMUNITY)
Admission: RE | Disposition: A | Discharge status: Home / Self Care | Payer: Self-pay | Source: Ambulatory Visit | Attending: Cardiovascular Disease

## 2017-03-12 ENCOUNTER — Ambulatory Visit (HOSPITAL_COMMUNITY): Payer: Medicare Other | Admitting: Certified Registered"

## 2017-03-12 ENCOUNTER — Encounter (HOSPITAL_COMMUNITY): Payer: Self-pay | Admitting: *Deleted

## 2017-03-12 ENCOUNTER — Ambulatory Visit (HOSPITAL_COMMUNITY)
Admission: RE | Admit: 2017-03-12 | Discharge: 2017-03-12 | Disposition: A | Discharge status: Home / Self Care | Payer: Medicare Other | Source: Ambulatory Visit | Attending: Cardiovascular Disease | Admitting: Cardiovascular Disease

## 2017-03-12 DIAGNOSIS — M1990 Unspecified osteoarthritis, unspecified site: Secondary | ICD-10-CM | POA: Diagnosis not present

## 2017-03-12 DIAGNOSIS — I4891 Unspecified atrial fibrillation: Secondary | ICD-10-CM | POA: Insufficient documentation

## 2017-03-12 DIAGNOSIS — Z7901 Long term (current) use of anticoagulants: Secondary | ICD-10-CM | POA: Insufficient documentation

## 2017-03-12 DIAGNOSIS — I481 Persistent atrial fibrillation: Secondary | ICD-10-CM

## 2017-03-12 DIAGNOSIS — I10 Essential (primary) hypertension: Secondary | ICD-10-CM | POA: Diagnosis not present

## 2017-03-12 DIAGNOSIS — Z8546 Personal history of malignant neoplasm of prostate: Secondary | ICD-10-CM | POA: Insufficient documentation

## 2017-03-12 DIAGNOSIS — Z96652 Presence of left artificial knee joint: Secondary | ICD-10-CM | POA: Insufficient documentation

## 2017-03-12 DIAGNOSIS — I4819 Other persistent atrial fibrillation: Secondary | ICD-10-CM

## 2017-03-12 HISTORY — PX: CARDIOVERSION: SHX1299

## 2017-03-12 LAB — POCT I-STAT 4, (NA,K, GLUC, HGB,HCT)
Glucose, Bld: 113 mg/dL — ABNORMAL HIGH (ref 65–99)
HCT: 47 % (ref 39.0–52.0)
HEMOGLOBIN: 16 g/dL (ref 13.0–17.0)
Potassium: 4.8 mmol/L (ref 3.5–5.1)
SODIUM: 136 mmol/L (ref 135–145)

## 2017-03-12 SURGERY — CARDIOVERSION
Anesthesia: General

## 2017-03-12 MED ORDER — SODIUM CHLORIDE 0.9 % IV SOLN
INTRAVENOUS | Status: DC
  Administered 2017-03-12 (×2): via INTRAVENOUS

## 2017-03-12 MED ORDER — LIDOCAINE HCL (CARDIAC) 20 MG/ML IV SOLN
INTRAVENOUS | Status: DC | PRN
  Administered 2017-03-12: 50 mg via INTRAVENOUS

## 2017-03-12 MED ORDER — PROPOFOL 10 MG/ML IV BOLUS
INTRAVENOUS | Status: DC | PRN
  Administered 2017-03-12: 50 mg via INTRAVENOUS

## 2017-03-12 NOTE — Op Note (Signed)
Procedure: Electrical Cardioversion Indications:  Atrial Fibrillation  Procedure Details:  Consent: Risks of procedure as well as the alternatives and risks of each were explained to the (patient/caregiver).  Consent for procedure obtained.  Time Out: Verified patient identification, verified procedure, site/side was marked, verified correct patient position, special equipment/implants available, medications/allergies/relevent history reviewed, required imaging and test results available.  Performed  Patient placed on cardiac monitor, pulse oximetry, supplemental oxygen as necessary.  Sedation given: propofol 50 mg IV Pacer pads placed anterior and posterior chest.  Cardioverted 1 time(s).  Cardioversion with synchronized biphasic 120J shock.  Evaluation: Findings: Post procedure EKG shows: NSR Complications: None Patient did tolerate procedure well.  Time Spent Directly with the Patient:  30 minutes   Nakaya Mishkin 03/12/2017, 9:53 AM

## 2017-03-12 NOTE — Discharge Instructions (Signed)
Electrical Cardioversion, Care After °This sheet gives you information about how to care for yourself after your procedure. Your health care provider may also give you more specific instructions. If you have problems or questions, contact your health care provider. °What can I expect after the procedure? °After the procedure, it is common to have: °· Some redness on the skin where the shocks were given. ° °Follow these instructions at home: °· Do not drive for 24 hours if you were given a medicine to help you relax (sedative). °· Take over-the-counter and prescription medicines only as told by your health care provider. °· Ask your health care provider how to check your pulse. Check it often. °· Rest for 48 hours after the procedure or as told by your health care provider. °· Avoid or limit your caffeine use as told by your health care provider. °Contact a health care provider if: °· You feel like your heart is beating too quickly or your pulse is not regular. °· You have a serious muscle cramp that does not go away. °Get help right away if: °· You have discomfort in your chest. °· You are dizzy or you feel faint. °· You have trouble breathing or you are short of breath. °· Your speech is slurred. °· You have trouble moving an arm or leg on one side of your body. °· Your fingers or toes turn cold or blue. °This information is not intended to replace advice given to you by your health care provider. Make sure you discuss any questions you have with your health care provider. °Document Released: 05/28/2013 Document Revised: 03/10/2016 Document Reviewed: 02/11/2016 °Elsevier Interactive Patient Education © 2018 [employer] ° °

## 2017-03-12 NOTE — Anesthesia Procedure Notes (Signed)
Procedure Name: MAC Date/Time: 03/12/2017 9:54 AM Performed by: Teressa Lower Pre-anesthesia Checklist: Patient identified, Emergency Drugs available, Suction available, Patient being monitored and Timeout performed Patient Re-evaluated:Patient Re-evaluated prior to induction Oxygen Delivery Method: Nasal cannula

## 2017-03-12 NOTE — H&P (View-Only) (Signed)
Cardiology Office Note   Date:  03/04/2017   ID:  [name], DOB 03-18-1946, MRN [id number]  PCP:  Orpah Melter, MD  Cardiologist:   Minus Breeding, MD  Referring:  Orpah Melter, MD  Chief Complaint  Patient presents with  . Atrial Fibrillation      History of Present Illness: Ronnie Booth is a 71 y.o. male who presents for evaluation of SOB.   He presented with this complaint to his primary provider in May. He was found to be in atrial fibrillation and started on Cardizem. His lisinopril was decreased. He was started on Xarelto as well. He describes the shortness of breath as a feeling like he doesn't take a deep breath. He doesn't describe any dyspnea with exertion. She doesn't describe any PND or orthopnea. He has no palpitations, presyncope or syncope. He wouldn't know that he was in fibrillation. He simply has a sense that he has to sigh to get a breath and it is not a consistent thing. 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 He's tolerated the Xarelto and has had no bleeding issues.     Past Medical History:  Diagnosis Date  . Arthritis   . Cancer [facility])    prostate cancer  . Hypertension   . Kidney stones     Past Surgical History:  Procedure Laterality Date  . APPENDECTOMY    . COLONOSCOPY    . JOINT REPLACEMENT Left    knee  . LITHOTRIPSY    . PROSTATE SURGERY  2002  . REVERSE SHOULDER ARTHROPLASTY Right 01/13/2016   Procedure: RIGHT REVERSE SHOULDER ARTHROPLASTY;  Surgeon: Justice Britain, MD;  Location: [place] [facility];  Service: Orthopedics;  Laterality: Right;     Current Outpatient Prescriptions  Medication Sig Dispense Refill  . B Complex-C (SUPER B COMPLEX PO) Take 1 tablet by mouth daily.    . bisoprolol-hydrochlorothiazide (ZIAC) 10-6.25 MG tablet Take 1 tablet by mouth every morning.    . Cholecalciferol (VITAMIN D3) 2000 units TABS Take 1 tablet by mouth daily.    . Magnesium 250 MG TABS Take 1 tablet by  mouth daily.    . naproxen sodium (ALEVE) 220 MG tablet Take 440 mg by mouth every morning.    . Omega-3 Fatty Acids (FISH OIL) 1200 MG CAPS Take 1 capsule by mouth daily.    . ondansetron (ZOFRAN) 4 MG tablet Take 1 tablet (4 mg total) by mouth every 8 (eight) hours as needed for nausea or vomiting. 20 tablet 0  . oxyCODONE-acetaminophen (PERCOCET) 5-325 MG tablet Take 1-2 tablets by mouth every 4 (four) hours as needed. 60 tablet 0  . Potassium Gluconate 550 (90 K) MG TABS Take 1 tablet by mouth daily.    Marland Kitchen lisinopril (PRINIVIL,ZESTRIL) 40 MG tablet Take 1 tablet (40 mg total) by mouth daily. 90 tablet 3   No current facility-administered medications for this visit.     Allergies:   Patient has no known allergies.    Social History:  The patient  reports that he has never smoked. He has never used smokeless tobacco. He reports that he drinks alcohol. He reports that he does not use drugs.   Family History:  The patient's family history includes Alzheimer's disease in his mother; Lung cancer in his father; Parkinson's disease in his mother.    ROS:  Please see the history of present illness.   Otherwise, review of systems are positive for none.  All other systems are reviewed and negative.    PHYSICAL EXAM: VS:  BP (!) 170/110   Pulse 98   Ht 6' 1.5" (1.867 m)   Wt 187 lb 12.8 oz (85.2 kg)   BMI 24.44 kg/m  , BMI Body mass index is 24.44 kg/m. GENERAL:  Well appearing HEENT:  Pupils equal round and reactive, fundi not visualized, oral mucosa unremarkable NECK:  No jugular venous distention, waveform within normal limits, carotid upstroke brisk and symmetric, no bruits, no thyromegaly LYMPHATICS:  No cervical, inguinal adenopathy LUNGS:  Clear to auscultation bilaterally BACK:  No CVA tenderness CHEST:  Unremarkable HEART:  PMI not displaced or sustained,S1 and S2 within normal limits, no S3, no clicks, no rubs, no murmurs, irregular ABD:  Flat, positive bowel sounds normal in  frequency in pitch, no bruits, no rebound, no guarding, no midline pulsatile mass, no hepatomegaly, no splenomegaly EXT:  2 plus pulses throughout, no edema, no cyanosis no clubbing SKIN:  No rashes no nodules NEURO:  Cranial nerves II through XII grossly intact, motor grossly intact throughout PSYCH:  Cognitively intact, oriented to person place and time    EKG:  EKG is ordered today. The ekg ordered today demonstrates atrial fibrillation, rate 98, axis within normal limits, intervals within normal limits, no acute ST-T wave changes.   Recent Labs: No results found for requested labs within last 8760 hours.    Lipid Panel No results found for: CHOL, TRIG, HDL, CHOLHDL, VLDL, LDLCALC, LDLDIRECT    Wt Readings from Last 3 Encounters:  03/02/17 187 lb 12.8 oz (85.2 kg)  01/13/16 193 lb (87.5 kg)      Other studies Reviewed: Additional studies/ records that were reviewed today include: Office records. Review of the above records demonstrates:  Please see elsewhere in the note.     ASSESSMENT AND PLAN:    ATRIAL FIB:  Mr. Ronnie Booth has a CHA2DS2 - VASc score of 2.  He has some vague symptoms that might be related to the fibrillation. I will discuss the risks and benefits of cardioversion with him and will proceed with this. He tolerates his anticoagulation and has been taking this uninterrupted.  I will check CBC and BMET.  I will check an echo.  Of note I believe to be persistent not paroxysmal as it has been evident on repeat EKGs. Also did note that his TSH was normal as were other electrolytes.  SOB:  This will be evaluated with the echo above.    HTN:  I will increase the lisinopril to 40 mg daily.      Current medicines are reviewed at length with the patient today.  The patient does not have concerns regarding medicines.  The following changes have been made:  no change  Labs/ tests ordered today include:   Orders Placed This Encounter  Procedures  .  ELECTRICAL CARDIOVERSION  . CBC  . Comprehensive Metabolic Panel (CMET)  . TSH  . INR/PT  . APTT  . ECHOCARDIOGRAM COMPLETE     Disposition:   FU with me after the DCCV.      Signed, Minus Breeding, MD  03/04/2017 5:58 PM    [facility]

## 2017-03-12 NOTE — Interval H&P Note (Signed)
History and Physical Interval Note:  03/12/2017 9:46 AM  Ronnie Booth  has presented today for surgery, with the diagnosis of AFIB  The various methods of treatment have been discussed with the patient and family. After consideration of risks, benefits and other options for treatment, the patient has consented to  Procedure(s): CARDIOVERSION (N/A) as a surgical intervention .  The patient's history has been reviewed, patient examined, no change in status, stable for surgery.  I have reviewed the patient's chart and labs.  Questions were answered to the patient's satisfaction.     Mahari Strahm

## 2017-03-12 NOTE — Anesthesia Preprocedure Evaluation (Addendum)
Anesthesia Evaluation  Patient identified by MRN, date of birth, ID band Patient awake    Reviewed: Allergy & Precautions, NPO status , Patient's Chart, lab work & pertinent test results  Airway Mallampati: I  TM Distance: >3 FB Neck ROM: Full    Dental  (+) Teeth Intact, Dental Advisory Given   Pulmonary neg pulmonary ROS,    breath sounds clear to auscultation       Cardiovascular hypertension, negative cardio ROS  + dysrhythmias Atrial Fibrillation  Rhythm:Irregular Rate:Abnormal     Neuro/Psych negative neurological ROS  negative psych ROS   GI/Hepatic negative GI ROS, Neg liver ROS,   Endo/Other  negative endocrine ROS  Renal/GU Renal disease     Musculoskeletal  (+) Arthritis ,   Abdominal   Peds  Hematology negative hematology ROS (+)   Anesthesia Other Findings Day of surgery medications reviewed with the patient.  Reproductive/Obstetrics                          Lab Results  Component Value Date   WBC 8.0 03/09/2017   HGB 16.0 03/12/2017   HCT 47.0 03/12/2017   MCV 89 03/09/2017   PLT 232 03/09/2017   Lab Results  Component Value Date   INR 1.3 (H) 03/09/2017   Echo: - Left ventricle: The cavity size was normal. Wall thickness was   increased in a pattern of mild LVH. Systolic function was   moderately reduced. The estimated ejection fraction was in the   range of 35% to 40%. Diffuse hypokinesis. - Aortic valve: Mildly to moderately calcified annulus. There was   mild regurgitation. - Mitral valve: There was moderate regurgitation. - Left atrium: The atrium was severely dilated.   Anesthesia Physical Anesthesia Plan  ASA: II  Anesthesia Plan: General   Post-op Pain Management:    Induction: Intravenous  PONV Risk Score and Plan: Propofol  Airway Management Planned: Mask  Additional Equipment:   Intra-op Plan:   Post-operative Plan:   Informed  Consent: I have reviewed the patients History and Physical, chart, labs and discussed the procedure including the risks, benefits and alternatives for the proposed anesthesia with the patient or authorized representative who has indicated his/her understanding and acceptance.     Plan Discussed with:   Anesthesia Plan Comments:         Anesthesia Quick Evaluation

## 2017-03-12 NOTE — Anesthesia Postprocedure Evaluation (Signed)
Anesthesia Post Note  Patient: BASSAM DRESCH  Procedure(s) Performed: Procedure(s) (LRB): CARDIOVERSION (N/A)     Patient location during evaluation: PACU Anesthesia Type: General Level of consciousness: awake and alert Pain management: pain level controlled Vital Signs Assessment: post-procedure vital signs reviewed and stable Respiratory status: spontaneous breathing, nonlabored ventilation, respiratory function stable and patient connected to nasal cannula oxygen Cardiovascular status: blood pressure returned to baseline and stable Postop Assessment: no signs of nausea or vomiting Anesthetic complications: no    Last Vitals:  Vitals:   03/12/17 1000 03/12/17 1015  BP: 101/77 118/81  Pulse: (!) 56 (!) 52  Resp: 17 18  Temp: 36.7 C     Last Pain:  Vitals:   03/12/17 1000  TempSrc: Oral                 Effie Berkshire

## 2017-03-12 NOTE — Transfer of Care (Signed)
Immediate Anesthesia Transfer of Care Note  Patient: Ronnie Booth  Procedure(s) Performed: Procedure(s): CARDIOVERSION (N/A)  Patient Location: Endoscopy Unit  Anesthesia Type:General  Level of Consciousness: awake, alert  and oriented  Airway & Oxygen Therapy: Patient Spontanous Breathing and Patient connected to nasal cannula oxygen  Post-op Assessment: Report given to RN and Post -op Vital signs reviewed and stable  Post vital signs: Reviewed and stable  Last Vitals:  Vitals:   03/12/17 0846 03/12/17 0942  BP: (!) 159/110 (!) 142/101  Pulse: 97 84  Resp: 16 14  Temp: 36.6 C     Last Pain:  Vitals:   03/12/17 0846  TempSrc: Oral         Complications: No apparent anesthesia complications

## 2017-03-15 ENCOUNTER — Encounter (HOSPITAL_COMMUNITY): Payer: Self-pay | Admitting: Cardiovascular Disease

## 2017-03-25 NOTE — Progress Notes (Signed)
Cardiology Office Note   Date:  03/27/2017   ID:  Paulmichael, Schreck August 28, 1945, MRN [id number]  PCP:  Orpah Melter, MD  Cardiologist:   Minus Breeding, MD  Referring:  Orpah Melter, MD  Chief Complaint  Patient presents with  . Atrial Fibrillation      History of Present Illness: Ronnie Booth is a 71 y.o. male who presents for evaluation of SOB.   He presented with this complaint to his primary provider in May. He was found to be in atrial fibrillation and started on Cardizem. His lisinopril was decreased. He was started on Xarelto as well. He is now status post DCCV.  He thinks that his breathing is better.  He does not notice any irregular heart rate.  The patient denies any new symptoms such as chest discomfort, neck or arm discomfort. There has been no new shortness of breath, PND or orthopnea. There have been no reported palpitations, presyncope or syncope.  However, he is back in atrial fib.    Past Medical History:  Diagnosis Date  . Arthritis   . Cancer [facility])    prostate cancer  . Hypertension   . Kidney stones     Past Surgical History:  Procedure Laterality Date  . APPENDECTOMY    . CARDIOVERSION N/A 03/12/2017   Procedure: CARDIOVERSION;  Surgeon: Sanda Klein, MD;  [facility];  Service: Cardiovascular;  Laterality: N/A;  . COLONOSCOPY    . JOINT REPLACEMENT Left    knee  . LITHOTRIPSY    . PROSTATE SURGERY  2002  . REVERSE SHOULDER ARTHROPLASTY Right 01/13/2016   Procedure: RIGHT REVERSE SHOULDER ARTHROPLASTY;  Surgeon: Justice Britain, MD;  Location: [place];  Service: Orthopedics;  Laterality: Right;     Current Outpatient Prescriptions  Medication Sig Dispense Refill  . B Complex-C (SUPER B COMPLEX PO) Take 1 tablet by mouth daily.    . bisoprolol-hydrochlorothiazide (ZIAC) 10-6.25 MG tablet Take 1 tablet by mouth every morning.    . Cholecalciferol (VITAMIN D3) 2000 units TABS Take 1 tablet by mouth daily.    Marland Kitchen  lisinopril (PRINIVIL,ZESTRIL) 40 MG tablet Take 1 tablet (40 mg total) by mouth daily. 90 tablet 3  . Magnesium 250 MG TABS Take 1 tablet by mouth daily.    . naproxen sodium (ALEVE) 220 MG tablet Take 440 mg by mouth every morning.    . Omega-3 Fatty Acids (FISH OIL) 1200 MG CAPS Take 1 capsule by mouth daily.    . ondansetron (ZOFRAN) 4 MG tablet Take 1 tablet (4 mg total) by mouth every 8 (eight) hours as needed for nausea or vomiting. 20 tablet 0  . oxyCODONE-acetaminophen (PERCOCET) 5-325 MG tablet Take 1-2 tablets by mouth every 4 (four) hours as needed. 60 tablet 0  . Potassium Gluconate 550 (90 K) MG TABS Take 1 tablet by mouth daily.    . rivaroxaban (XARELTO) 20 MG TABS tablet Take 20 mg by mouth daily with supper.     No current facility-administered medications for this visit.     Allergies:   Patient has no known allergies.    ROS:  Please see the history of present illness.   Otherwise, review of systems are positive for none.   All other systems are reviewed and negative.    PHYSICAL EXAM: VS:  BP 118/84   Pulse 93   Ht 6\' 2"  (1.88 m)   Wt 188 lb (85.3 kg)   BMI 24.14 kg/m  , BMI  Body mass index is 24.14 kg/m.  GENERAL:  Well appearing NECK:  No jugular venous distention, waveform within normal limits, carotid upstroke brisk and symmetric, no bruits, no thyromegaly LUNGS:  Clear to auscultation bilaterally BACK:  No CVA tenderness CHEST:  Unremarkable HEART:  PMI not displaced or sustained,S1 and S2 within normal limits, no S3, no clicks, no rubs, no murmurs, irregular ABD:  Flat, positive bowel sounds normal in frequency in pitch, no bruits, no rebound, no guarding, no midline pulsatile mass, no hepatomegaly, no splenomegaly EXT:  2 plus pulses throughout, trace edema, no cyanosis no clubbing   EKG:  EKG is  ordered today. The ekg ordered today demonstrates atrial fibrillation, rate 93, axis within normal limits, intervals within normal limits, no acute ST-T wave  changes.   Recent Labs: 03/09/2017: ALT 30; BUN 34; Creatinine, Ser 1.18; Platelets 232; TSH 1.730 03/12/2017: Hemoglobin 16.0; Potassium 4.8; Sodium 136    Lipid Panel No results found for: CHOL, TRIG, HDL, CHOLHDL, VLDL, LDLCALC, LDLDIRECT    Wt Readings from Last 3 Encounters:  03/26/17 188 lb (85.3 kg)  03/12/17 187 lb (84.8 kg)  03/02/17 187 lb 12.8 oz (85.2 kg)      Other studies Reviewed: Additional studies/ records that were reviewed today include: None Review of the above records demonstrates:     ASSESSMENT AND PLAN:    ATRIAL FIB:  [name] has a CHA2DS2 - VASc score of 2.  He is status post DCCV.  However, he did not maintain NSR.  I am going to check a Holter to see if he has good rate control.  He will continue the anticoagulation.    CARDIOMYOPATHY:  I will screen with a Lexiscan Myoview.  I will titrate meds in the future.    HTN:    I will increase the lisinopril to 40 mg daily.   This will be treated in the context of treating the cardiomyopathy.      Current medicines are reviewed at length with the patient today.  The patient does not have concerns regarding medicines.  The following changes have been made:  None  Labs/ tests ordered today include:    Orders Placed This Encounter  Procedures  . Holter monitor - 24 hour  . Myocardial Perfusion Imaging  . EKG 12-Lead     Disposition:   FU with me after the Holter an Lexiscan Myoview.  Signed, Minus Breeding, MD  03/27/2017 12:08 PM    [facility]

## 2017-03-26 ENCOUNTER — Ambulatory Visit (INDEPENDENT_AMBULATORY_CARE_PROVIDER_SITE_OTHER): Payer: Medicare Other | Admitting: Cardiology

## 2017-03-26 ENCOUNTER — Encounter: Payer: Self-pay | Admitting: Cardiology

## 2017-03-26 VITALS — BP 118/84 | HR 93 | Ht 74.0 in | Wt 188.0 lb

## 2017-03-26 DIAGNOSIS — I429 Cardiomyopathy, unspecified: Secondary | ICD-10-CM

## 2017-03-26 DIAGNOSIS — I481 Persistent atrial fibrillation: Secondary | ICD-10-CM

## 2017-03-26 DIAGNOSIS — I10 Essential (primary) hypertension: Secondary | ICD-10-CM

## 2017-03-26 DIAGNOSIS — I4819 Other persistent atrial fibrillation: Secondary | ICD-10-CM

## 2017-03-26 NOTE — Patient Instructions (Signed)
Medication Instructions:  Continue current medications  Labwork: None Ordered  Testing/Procedures: Your physician has requested that you have a lexiscan myoview. For further information please visit [email or web address]. Please follow instruction sheet, as given.  Your physician has recommended that you wear a holter monitor for 24 hours. Holter monitors are medical devices that record the heart's electrical activity. Doctors most often use these monitors to diagnose arrhythmias. Arrhythmias are problems with the speed or rhythm of the heartbeat. The monitor is a small, portable device. You can wear one while you do your normal daily activities. This is usually used to diagnose what is causing palpitations/syncope (passing out).   Follow-Up: Your physician recommends that you schedule a follow-up appointment in: After Test   Any Other Special Instructions Will Be Listed Below (If Applicable).   If you need a refill on your cardiac medications before your next appointment, please call your pharmacy.   Thank you for choosing [facility] at [facility]!!

## 2017-03-27 ENCOUNTER — Ambulatory Visit (INDEPENDENT_AMBULATORY_CARE_PROVIDER_SITE_OTHER): Payer: Medicare Other

## 2017-03-27 ENCOUNTER — Encounter: Payer: Self-pay | Admitting: Cardiology

## 2017-03-27 DIAGNOSIS — I481 Persistent atrial fibrillation: Secondary | ICD-10-CM | POA: Diagnosis not present

## 2017-03-27 DIAGNOSIS — I4819 Other persistent atrial fibrillation: Secondary | ICD-10-CM

## 2017-03-28 ENCOUNTER — Telehealth (HOSPITAL_COMMUNITY): Payer: Self-pay

## 2017-03-28 NOTE — Telephone Encounter (Signed)
Encounter complete. 

## 2017-03-30 ENCOUNTER — Ambulatory Visit (HOSPITAL_COMMUNITY)
Admission: RE | Admit: 2017-03-30 | Discharge: 2017-03-30 | Disposition: A | Discharge status: Home / Self Care | Payer: Medicare Other | Source: Ambulatory Visit | Attending: Cardiovascular Disease | Admitting: Cardiovascular Disease

## 2017-03-30 DIAGNOSIS — Z8546 Personal history of malignant neoplasm of prostate: Secondary | ICD-10-CM | POA: Diagnosis not present

## 2017-03-30 DIAGNOSIS — R0609 Other forms of dyspnea: Secondary | ICD-10-CM | POA: Diagnosis not present

## 2017-03-30 DIAGNOSIS — I4891 Unspecified atrial fibrillation: Secondary | ICD-10-CM | POA: Insufficient documentation

## 2017-03-30 DIAGNOSIS — I429 Cardiomyopathy, unspecified: Secondary | ICD-10-CM | POA: Diagnosis not present

## 2017-03-30 LAB — MYOCARDIAL PERFUSION IMAGING
CSEPPHR: 105 {beats}/min
Rest HR: 97 {beats}/min
SDS: 2
SRS: 2
SSS: 4
TID: 1.2

## 2017-03-30 MED ORDER — TECHNETIUM TC 99M TETROFOSMIN IV KIT
10.6000 | PACK | Freq: Once | INTRAVENOUS | Status: AC | PRN
  Administered 2017-03-30: 10.6 via INTRAVENOUS
  Filled 2017-03-30: qty 11

## 2017-03-30 MED ORDER — REGADENOSON 0.4 MG/5ML IV SOLN
0.4000 mg | Freq: Once | INTRAVENOUS | Status: AC
  Administered 2017-03-30: 0.4 mg via INTRAVENOUS

## 2017-03-30 MED ORDER — TECHNETIUM TC 99M TETROFOSMIN IV KIT
32.4000 | PACK | Freq: Once | INTRAVENOUS | Status: AC | PRN
  Administered 2017-03-30: 32.4 via INTRAVENOUS
  Filled 2017-03-30: qty 33

## 2017-04-03 DIAGNOSIS — H2513 Age-related nuclear cataract, bilateral: Secondary | ICD-10-CM | POA: Diagnosis not present

## 2017-04-03 DIAGNOSIS — H43813 Vitreous degeneration, bilateral: Secondary | ICD-10-CM | POA: Diagnosis not present

## 2017-04-03 DIAGNOSIS — H17822 Peripheral opacity of cornea, left eye: Secondary | ICD-10-CM | POA: Diagnosis not present

## 2017-04-09 ENCOUNTER — Telehealth: Payer: Self-pay | Admitting: Cardiology

## 2017-04-09 NOTE — Telephone Encounter (Signed)
Pt aware of his test result 

## 2017-04-09 NOTE — Telephone Encounter (Signed)
New message ° ° ° °Pt is returning call about results. °

## 2017-05-01 DIAGNOSIS — M1711 Unilateral primary osteoarthritis, right knee: Secondary | ICD-10-CM | POA: Diagnosis not present

## 2017-05-03 NOTE — Progress Notes (Signed)
Cardiology Office Note   Date:  05/05/2017   ID:  LE FAULCON, DOB [birth year], MRN [id number]  PCP:  Orpah Melter, MD  Cardiologist:   Minus Breeding, MD    Chief Complaint  Patient presents with  . Atrial Fibrillation      History of Present Illness: Ronnie Booth is a 71 y.o. male who presents for evaluation of SOB.   He presented with this complaint to his primary provider in May. He was found to be in atrial fibrillation and started on Cardizem. His lisinopril was decreased. He was started on Xarelto as well. He had DCCV. However, he did not maintain NSR.  Follow up Holter demonstrated controlled ventricular rate.   He does have a reduced EF and had a negative Lexiscan Myoview.     Since I last saw him he has done well.  The patient denies any new symptoms such as chest discomfort, neck or arm discomfort. There has been no new shortness of breath, PND or orthopnea. There have been no reported palpitations, presyncope or syncope.  He is working and walks quite a bit with this.  He has no problems related to this.      Past Medical History:  Diagnosis Date  . Arthritis   . Cancer [facility])    prostate cancer  . Hypertension   . Kidney stones     Past Surgical History:  Procedure Laterality Date  . APPENDECTOMY    . CARDIOVERSION N/A 03/12/2017   Procedure: CARDIOVERSION;  Surgeon: Sanda Klein, MD;  [facility];  Service: Cardiovascular;  Laterality: N/A;  . COLONOSCOPY    . JOINT REPLACEMENT Left    knee  . LITHOTRIPSY    . PROSTATE SURGERY  2002  . REVERSE SHOULDER ARTHROPLASTY Right 01/13/2016   Procedure: RIGHT REVERSE SHOULDER ARTHROPLASTY;  Surgeon: Justice Britain, MD;  Location: [place];  Service: Orthopedics;  Laterality: Right;     Current Outpatient Prescriptions  Medication Sig Dispense Refill  . B Complex-C (SUPER B COMPLEX PO) Take 1 tablet by mouth daily.    . bisoprolol-hydrochlorothiazide (ZIAC) 10-6.25 MG tablet Take 1  tablet by mouth every morning.    . Cholecalciferol (VITAMIN D3) 2000 units TABS Take 1 tablet by mouth daily.    . Magnesium 250 MG TABS Take 1 tablet by mouth daily.    . Omega-3 Fatty Acids (FISH OIL) 1200 MG CAPS Take 1 capsule by mouth daily.    . Potassium Gluconate 550 (90 K) MG TABS Take 1 tablet by mouth daily.    . rivaroxaban (XARELTO) 20 MG TABS tablet Take 20 mg by mouth daily with supper.    . ondansetron (ZOFRAN) 4 MG tablet Take 1 tablet (4 mg total) by mouth every 8 (eight) hours as needed for nausea or vomiting. (Patient not taking: Reported on 05/04/2017) 20 tablet 0  . sacubitril-valsartan (ENTRESTO) 49-51 MG Take 1 tablet by mouth 2 (two) times daily. 60 tablet 11   No current facility-administered medications for this visit.     Allergies:   Patient has no known allergies.    ROS:  Please see the history of present illness.   Otherwise, review of systems are positive for right knee pain. .   All other systems are reviewed and negative.    PHYSICAL EXAM: VS:  BP (!) 142/86   Pulse 80   Ht 6\' 2"  (1.88 m)   Wt 190 lb (86.2 kg)   BMI 24.39 kg/m  ,  BMI Body mass index is 24.39 kg/m.  GENERAL:  Well appearing NECK:  No jugular venous distention, waveform within normal limits, carotid upstroke brisk and symmetric, no bruits, no thyromegaly LUNGS:  Clear to auscultation bilaterally CHEST:  Unremarkable HEART:  PMI not displaced or sustained,S1 and S2 within normal limits, no S3,  no clicks, no rubs, no murmurs, irregular ABD:  Flat, positive bowel sounds normal in frequency in pitch, no bruits, no rebound, no guarding, no midline pulsatile mass, no hepatomegaly, no splenomegaly EXT:  2 plus pulses throughout, trace edema, no cyanosis no clubbing g   EKG:  EKG is not ordered today.    Recent Labs: 03/09/2017: ALT 30; BUN 34; Creatinine, Ser 1.18; Platelets 232; TSH 1.730 03/12/2017: Hemoglobin 16.0; Potassium 4.8; Sodium 136    Lipid Panel No results found for:  CHOL, TRIG, HDL, CHOLHDL, VLDL, LDLCALC, LDLDIRECT    Wt Readings from Last 3 Encounters:  05/04/17 190 lb (86.2 kg)  03/30/17 188 lb (85.3 kg)  03/26/17 188 lb (85.3 kg)      Other studies Reviewed: Additional studies/ records that were reviewed today include: Lexiscan Myoview, echo Review of the above records demonstrates:     ASSESSMENT AND PLAN:    ATRIAL FIB:  Mr. [name] has a CHA2DS2 - VASc score of 2.  He is status post DCCV. He did not maintain NSR and will stay in atrial fib.  The patient  tolerates this rhythm and rate control and anticoagulation. We will continue with the meds as listed.  CARDIOMYOPATHY:   There was no evidence of ischemia on Lexiscan Myoview.  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  His BP is slightly elevated. I will start with moderate dose Entresto and stop his Lisinopril.  He can come back in two months for med titration.    HTN:    This is being managed in the context of treating his CHF    Current medicines are reviewed at length with the patient today.  The patient does not have concerns regarding medicines.  The following changes have been made:  As above  Labs/ tests ordered today include:    No orders of the defined types were placed in this encounter.    Disposition:   FU with me in two months   Signed, Minus Breeding, MD  05/05/2017 3:05 PM    [facility]

## 2017-05-04 ENCOUNTER — Encounter: Payer: Self-pay | Admitting: Cardiology

## 2017-05-04 ENCOUNTER — Ambulatory Visit (INDEPENDENT_AMBULATORY_CARE_PROVIDER_SITE_OTHER): Payer: Medicare Other | Admitting: Cardiology

## 2017-05-04 VITALS — BP 142/86 | HR 80 | Ht 74.0 in | Wt 190.0 lb

## 2017-05-04 DIAGNOSIS — I10 Essential (primary) hypertension: Secondary | ICD-10-CM | POA: Diagnosis not present

## 2017-05-04 DIAGNOSIS — I482 Chronic atrial fibrillation: Secondary | ICD-10-CM | POA: Diagnosis not present

## 2017-05-04 DIAGNOSIS — I5022 Chronic systolic (congestive) heart failure: Secondary | ICD-10-CM

## 2017-05-04 DIAGNOSIS — I4821 Permanent atrial fibrillation: Secondary | ICD-10-CM

## 2017-05-04 MED ORDER — SACUBITRIL-VALSARTAN 49-51 MG PO TABS: 1.0000 | ORAL_TABLET | Freq: Two times a day (BID) | ORAL | 11 refills | Status: DC

## 2017-05-04 NOTE — Patient Instructions (Signed)
Medication Instructions:  STOP- Lisinopril  START- Entresto 49/51 mg daily take 36 hours after stopping Lisinopril  If you need a refill on your cardiac medications before your next appointment, please call your pharmacy.  Labwork: None Ordered  Testing/Procedures: None Ordered  Follow-Up: Your physician wants you to follow-up in: 2 Months.     Thank you for choosing [facility] at [facility]!!

## 2017-05-05 ENCOUNTER — Encounter: Payer: Self-pay | Admitting: Cardiology

## 2017-05-05 DIAGNOSIS — I5022 Chronic systolic (congestive) heart failure: Secondary | ICD-10-CM | POA: Insufficient documentation

## 2017-05-09 ENCOUNTER — Telehealth: Payer: Self-pay | Admitting: Cardiology

## 2017-05-09 NOTE — Telephone Encounter (Signed)
Awaiting approval for Entresto from covermymeds

## 2017-05-09 NOTE — Telephone Encounter (Signed)
New message     Pt c/o medication issue:  1. Name of Medication:  entresto 2. How are you currently taking this medication (dosage and times per day)?  49-51 3. Are you having a reaction (difficulty breathing--STAT)?  no 4. What is your medication issue?  Calling to get more clinical info for prior authorization

## 2017-05-10 NOTE — Telephone Encounter (Signed)
Pt Entresto was approved until 09/19

## 2017-05-21 DIAGNOSIS — M1711 Unilateral primary osteoarthritis, right knee: Secondary | ICD-10-CM | POA: Diagnosis not present

## 2017-05-30 DIAGNOSIS — M1711 Unilateral primary osteoarthritis, right knee: Secondary | ICD-10-CM | POA: Diagnosis not present

## 2017-06-06 DIAGNOSIS — M1711 Unilateral primary osteoarthritis, right knee: Secondary | ICD-10-CM | POA: Diagnosis not present

## 2017-06-18 DIAGNOSIS — Z23 Encounter for immunization: Secondary | ICD-10-CM | POA: Diagnosis not present

## 2017-06-20 ENCOUNTER — Other Ambulatory Visit: Payer: Self-pay | Admitting: *Deleted

## 2017-06-20 ENCOUNTER — Other Ambulatory Visit: Payer: Self-pay | Admitting: Pharmacist

## 2017-06-20 MED ORDER — RIVAROXABAN 20 MG PO TABS: 20.0000 mg | ORAL_TABLET | Freq: Every day | ORAL | 0 refills | Status: DC

## 2017-06-28 IMAGING — US US SOFT TISSUE HEAD/NECK
1 series · 14 of 25 positions shown · non-contrast
Comparison: None.

CLINICAL DATA: General neck fullness on physical examination.

EXAM:
THYROID ULTRASOUND
TECHNIQUE: Ultrasound examination of the thyroid gland and adjacent soft
tissues was performed.

[Series 1: us soft tissue head/neck · 0.08mm/px · 14 of 44 slices shown]
[im 1/44]
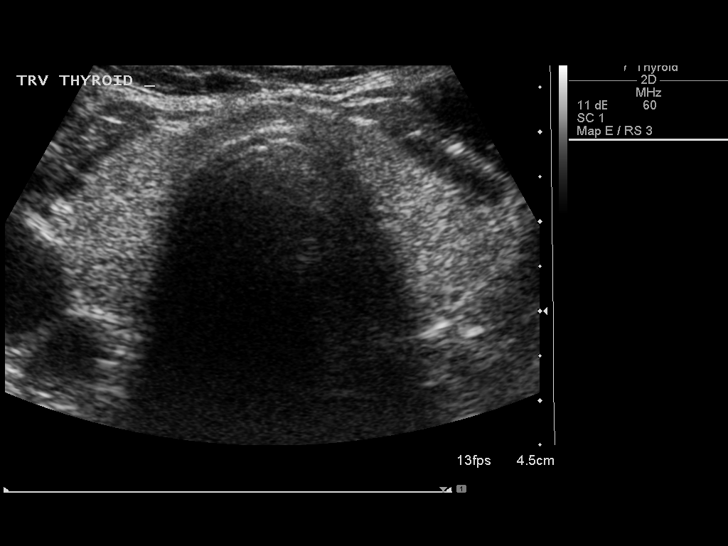
[im 4/44]
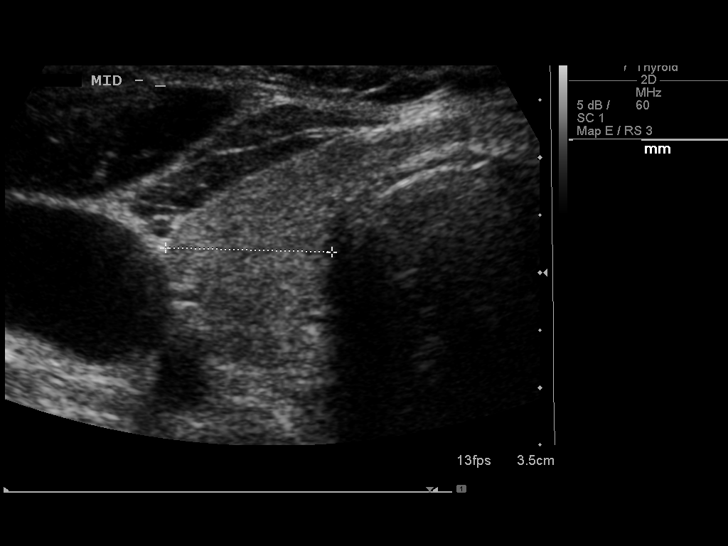
[im 8/44]
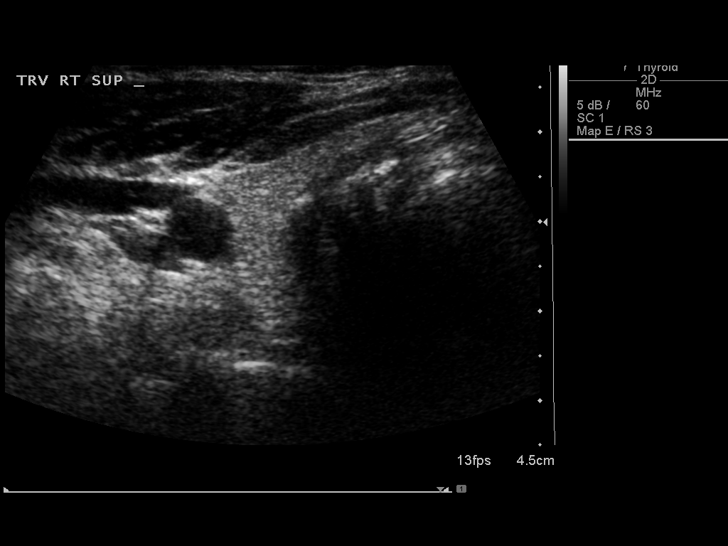
[im 11/44]
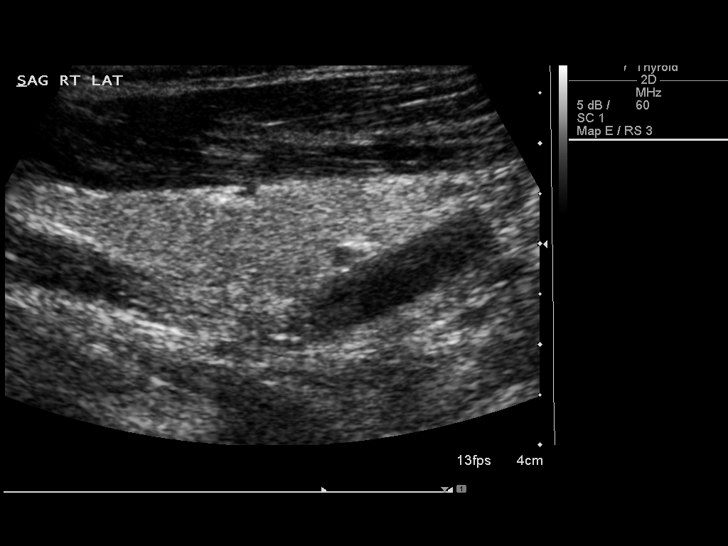
[im 15/44]
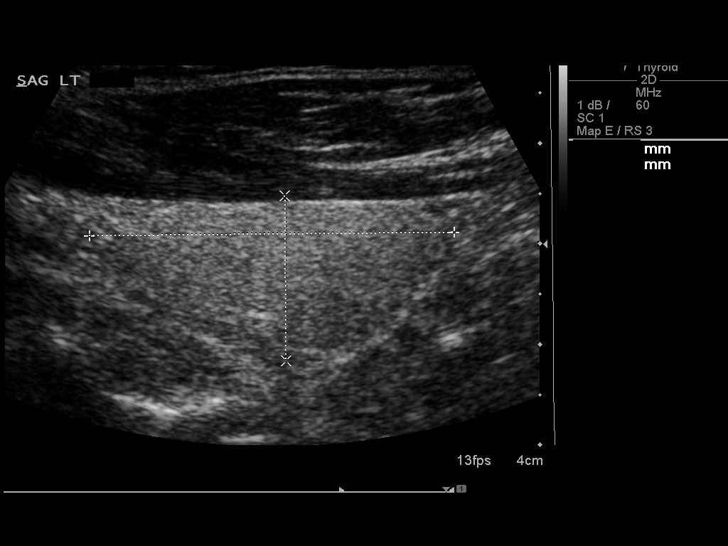
[im 17/44]
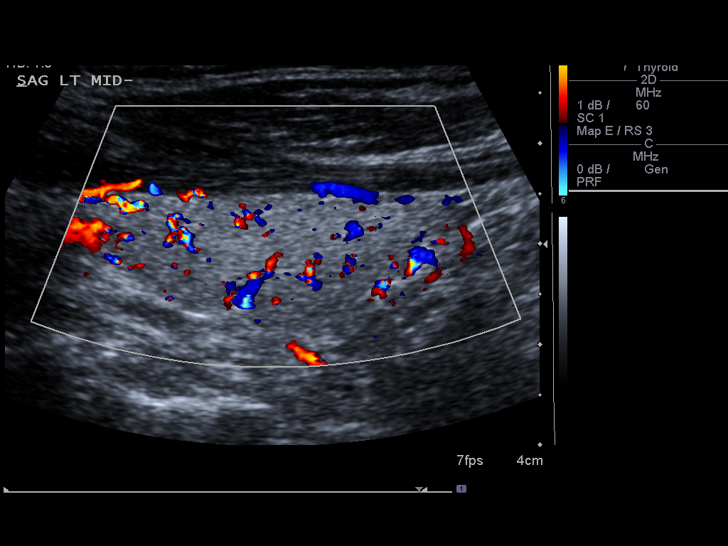
[im 20/44]
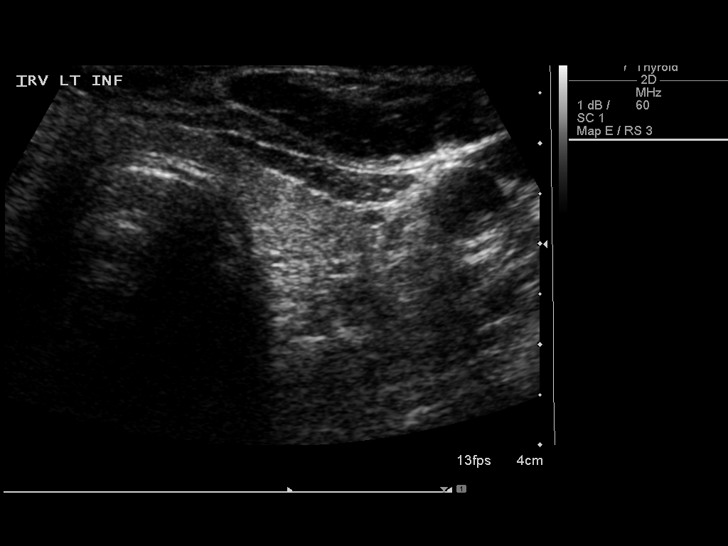
[im 24/44]
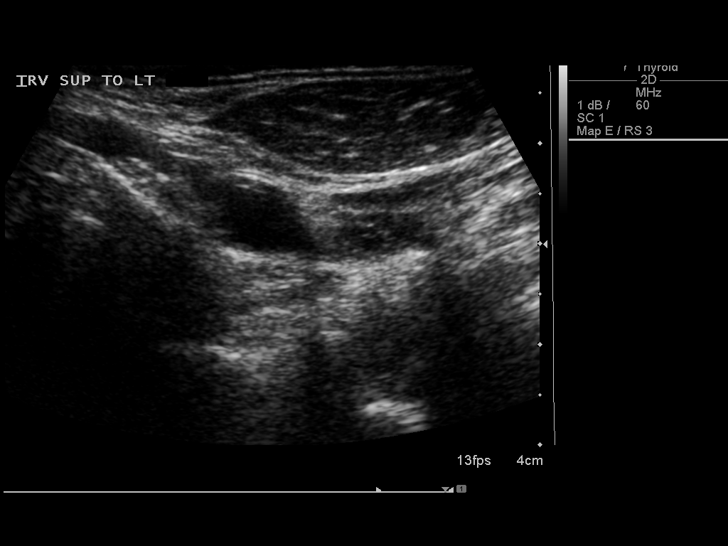
[im 27/44]
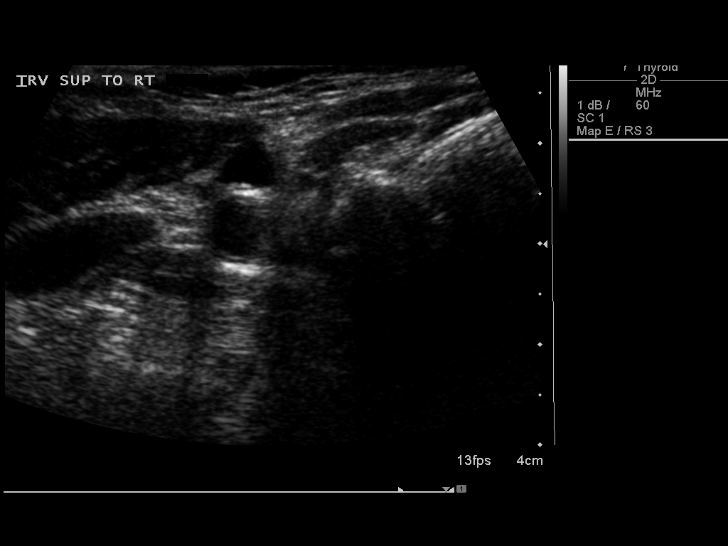
[im 29/44]
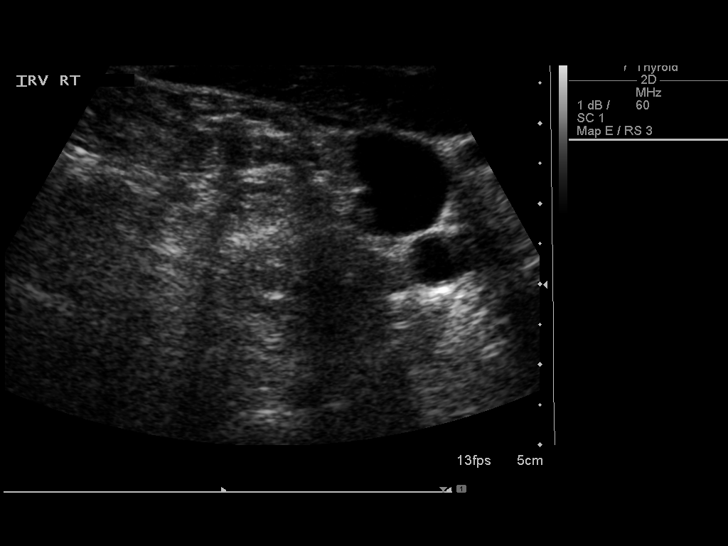
[im 33/44]
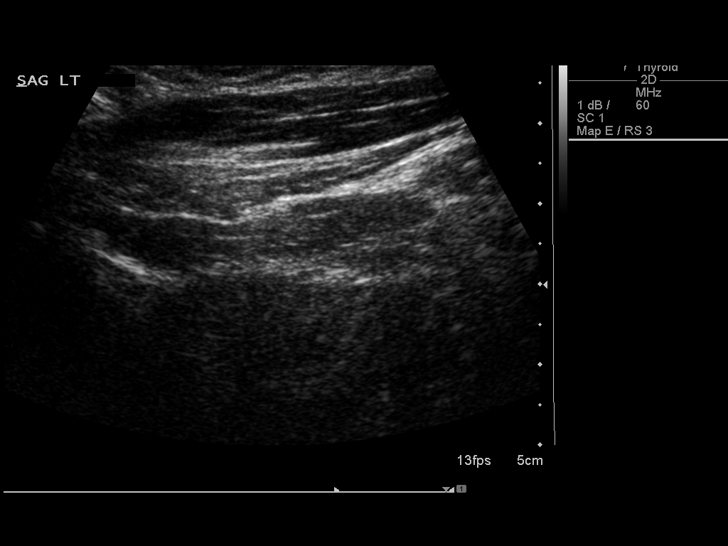
[im 36/44]
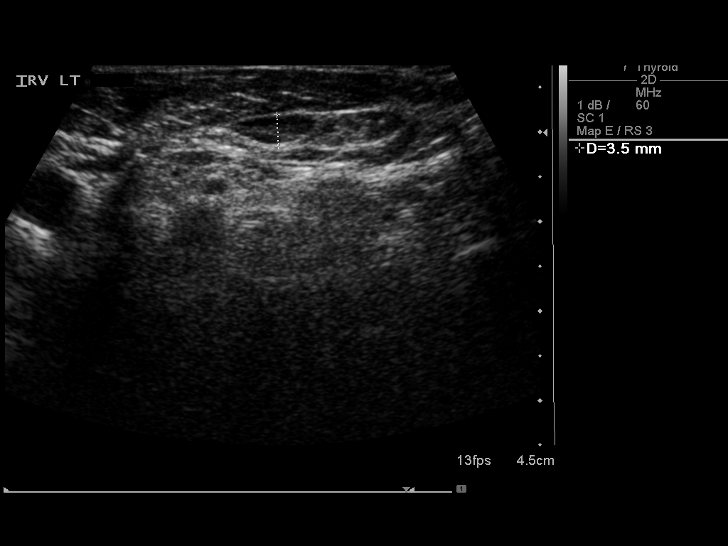
[im 40/44]
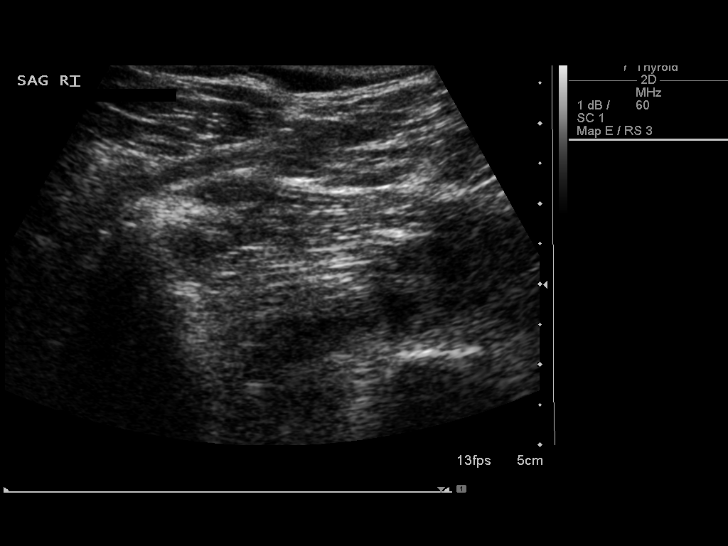
[im 44/44]
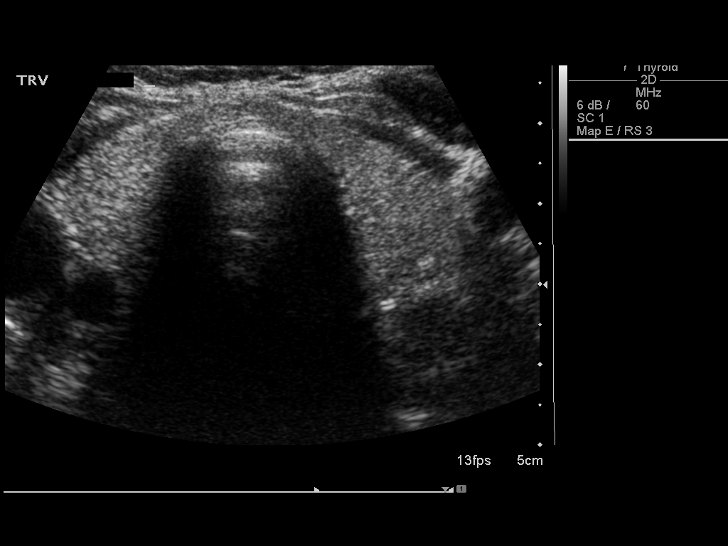

[14 of 25 positions shown; findings below may reference images not displayed]

FINDINGS: There is relative homogeneity of the thyroid parenchymal
echotexture.

Right thyroid lobe

Measurements: Normal in size measuring 4.9 x 1.7 x 1.5 cm

No discrete nodule or mass is identified within the right lobe of
the thyroid..

Left thyroid lobe

Measurements: Normal in size measuring 3.6 x 1.6 x 1.7 cm.

No discrete nodule or mass is identified within the left lobe of the
thyroid

Isthmus

Thickness: Normal in size measures 0.4 cm in diameter.

No discrete nodule or mass is identified within the thyroid isthmus.

Lymphadenopathy

None visualized.
IMPRESSION: Normal thyroid ultrasound. Specifically, no evidence of thyromegaly
or discrete thyroid nodule or mass.

## 2017-07-15 NOTE — Progress Notes (Signed)
Cardiology Office Note   Date:  07/16/2017   ID:  Ronnie Booth, DOB 1945-09-17, MRN [id number]  PCP:  Orpah Melter, MD  Cardiologist:   Minus Breeding, MD    Chief Complaint  Patient presents with  . Atrial Fibrillation      History of Present Illness: Ronnie Booth is a 71 y.o. male who presents for evaluation of SOB.   He presented with this complaint to his primary provider in May. He was found to be in atrial fibrillation and started on Cardizem. His lisinopril was decreased. He was started on Xarelto as well. He had DCCV. However, he did not maintain NSR.  Follow up Holter demonstrated controlled ventricular rate.   He does have a reduced EF and had a negative Lexiscan Myoview.     Since I last saw him has done OK.  He was moving [place]soil last week and he would get sightly winded with this but was not excessively short of breath.  He denies any palpitations, presyncope or syncope.  He is not having PND or orthopnea.  He has had no weight gain or edema.  He denies any chest pressure, neck or arm discomfort.  Of note at the last visit I did switch him from an ACE inhibitor to [facility] he has had some increased dizziness very slightly when he started this although this seems to have abated.    Past Medical History:  Diagnosis Date  . Arthritis   . Cancer [place])    prostate cancer  . Hypertension   . Kidney stones     Past Surgical History:  Procedure Laterality Date  . APPENDECTOMY    . CARDIOVERSION N/A 03/12/2017   Procedure: CARDIOVERSION;  Surgeon: Sanda Klein, MD;  [facility];  Service: Cardiovascular;  Laterality: N/A;  . COLONOSCOPY    . JOINT REPLACEMENT Left    knee  . LITHOTRIPSY    . PROSTATE SURGERY  2002  . REVERSE SHOULDER ARTHROPLASTY Right 01/13/2016   Procedure: RIGHT REVERSE SHOULDER ARTHROPLASTY;  Surgeon: Justice Britain, MD;  Location: [place];  Service: Orthopedics;  Laterality: Right;     Current Outpatient  Medications  Medication Sig Dispense Refill  . B Complex-C (SUPER B COMPLEX PO) Take 1 tablet by mouth daily.    . bisoprolol-hydrochlorothiazide (ZIAC) 10-6.25 MG tablet Take 1 tablet by mouth every morning.    Marland Kitchen CARTIA XT 120 MG 24 hr capsule Take 1 capsule by mouth daily.  0  . Cholecalciferol (VITAMIN D3) 2000 units TABS Take 1 tablet by mouth daily.    . Magnesium 250 MG TABS Take 1 tablet by mouth daily.    . Omega-3 Fatty Acids (FISH OIL) 1200 MG CAPS Take 1 capsule by mouth daily.    . ondansetron (ZOFRAN) 4 MG tablet Take 1 tablet (4 mg total) by mouth every 8 (eight) hours as needed for nausea or vomiting. 20 tablet 0  . Potassium Gluconate 550 (90 K) MG TABS Take 1 tablet by mouth daily.    . rivaroxaban (XARELTO) 20 MG TABS tablet Take 1 tablet (20 mg total) by mouth daily with supper. 90 tablet 0  . sacubitril-valsartan (ENTRESTO) 49-51 MG Take 1 tablet by mouth 2 (two) times daily. 60 tablet 11  . digoxin (LANOXIN) 0.125 MG tablet Take 1 tablet (0.125 mg total) by mouth daily. 90 tablet 3   No current facility-administered medications for this visit.     Allergies:   Patient has no known allergies.  ROS:  Please see the history of present illness.   Otherwise, review of systems are positive for none .   All other systems are reviewed and negative.    PHYSICAL EXAM: VS:  BP 108/70   Pulse 100   Ht 6\' 2"  (1.88 m)   Wt 186 lb (84.4 kg)   BMI 23.88 kg/m  , BMI Body mass index is 23.88 kg/m.  GENERAL:  Well appearing NECK:  No jugular venous distention, waveform within normal limits, carotid upstroke brisk and symmetric, no bruits, no thyromegaly LUNGS:  Clear to auscultation bilaterally CHEST:  Unremarkable HEART:  PMI not displaced or sustained,S1 and S2 within normal limits, no S3, no clicks, no rubs, no murmurs, irregular ABD:  Flat, positive bowel sounds normal in frequency in pitch, no bruits, no rebound, no guarding, no midline pulsatile mass, no hepatomegaly, no  splenomegaly EXT:  2 plus pulses throughout, no edema, no cyanosis no clubbing   EKG:  EKG is not  ordered today.   Recent Labs: 03/09/2017: ALT 30; BUN 34; Creatinine, Ser 1.18; Platelets 232; TSH 1.730 03/12/2017: Hemoglobin 16.0; Potassium 4.8; Sodium 136    Lipid Panel No results found for: CHOL, TRIG, HDL, CHOLHDL, VLDL, LDLCALC, LDLDIRECT    Wt Readings from Last 3 Encounters:  07/16/17 186 lb (84.4 kg)  05/04/17 190 lb (86.2 kg)  03/30/17 188 lb (85.3 kg)      Other studies Reviewed: Additional studies/ records that were reviewed today include: Lexiscan Myoview, echo Review of the above records demonstrates:     ASSESSMENT AND PLAN:    ATRIAL FIB:  Ronnie Booth has a CHA2DS2 - VASc score of 2.  He is status post DCCV.  He did not maintain NSR.  He had OK rate control although the average was 90 and his HR is 100 today.  He tolerates Xarelto.   Today I will add digoxin at a low dose for better rate control with his atrial fib and reduced EF.    CARDIOMYOPATHY:   There was no evidence of ischemia on Lexiscan Myoview.  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 At this point he will not tolerate med titration.  In the future I might stop the HCT component of his Ziac.  For now we will make the changes above.  HTN:    This is being managed in the context of treating his CHF    Current medicines are reviewed at length with the patient today.  The patient does not have concerns regarding medicines.  The following changes have been made:   As above  Labs/ tests ordered today include:   None  No orders of the defined types were placed in this encounter.    Disposition:   FU with me in 4 months   Signed, Minus Breeding, MD  07/16/2017 8:32 AM    [facility]

## 2017-07-16 ENCOUNTER — Ambulatory Visit: Payer: Medicare Other | Admitting: Cardiology

## 2017-07-16 ENCOUNTER — Encounter: Payer: Self-pay | Admitting: Cardiology

## 2017-07-16 VITALS — BP 108/70 | HR 100 | Ht 74.0 in | Wt 186.0 lb

## 2017-07-16 DIAGNOSIS — I5022 Chronic systolic (congestive) heart failure: Secondary | ICD-10-CM | POA: Diagnosis not present

## 2017-07-16 DIAGNOSIS — I4821 Permanent atrial fibrillation: Secondary | ICD-10-CM

## 2017-07-16 DIAGNOSIS — I482 Chronic atrial fibrillation: Secondary | ICD-10-CM | POA: Diagnosis not present

## 2017-07-16 DIAGNOSIS — I10 Essential (primary) hypertension: Secondary | ICD-10-CM

## 2017-07-16 MED ORDER — DIGOXIN 125 MCG PO TABS: 0.1250 mg | ORAL_TABLET | Freq: Every day | ORAL | 3 refills | Status: DC

## 2017-07-16 NOTE — Patient Instructions (Signed)
Medication Instructions:  START- Digoxin 0.125 mg daily  If you need a refill on your cardiac medications before your next appointment, please call your pharmacy.  Labwork: None Ordered   Testing/Procedures: None Ordered   Follow-Up: Your physician wants you to follow-up in: 3 Months.    Thank you for choosing [facility] at [facility]!!

## 2017-08-26 DIAGNOSIS — S00411A Abrasion of right ear, initial encounter: Secondary | ICD-10-CM | POA: Diagnosis not present

## 2017-10-12 DIAGNOSIS — E785 Hyperlipidemia, unspecified: Secondary | ICD-10-CM | POA: Diagnosis not present

## 2017-10-12 DIAGNOSIS — Z1159 Encounter for screening for other viral diseases: Secondary | ICD-10-CM | POA: Diagnosis not present

## 2017-10-12 DIAGNOSIS — Z0000 Encounter for general adult medical examination without abnormal findings: Secondary | ICD-10-CM | POA: Diagnosis not present

## 2017-10-12 DIAGNOSIS — I10 Essential (primary) hypertension: Secondary | ICD-10-CM | POA: Diagnosis not present

## 2017-10-12 DIAGNOSIS — M179 Osteoarthritis of knee, unspecified: Secondary | ICD-10-CM | POA: Diagnosis not present

## 2017-10-14 NOTE — Progress Notes (Signed)
Cardiology Office Note   Date:  10/17/2017   ID:  Zakhi, Dupre 30-Apr-1946, MRN [id number]  PCP:  Orpah Melter, MD  Cardiologist:   Minus Breeding, MD    Chief Complaint  Patient presents with  . Atrial Fibrillation      History of Present Illness: Ronnie Booth is a 72 y.o. male who presents for follow up of atrial fibrillation and started on Cardizem. His lisinopril was decreased. He was started on Xarelto as well. He had DCCV. However, he did not maintain NSR.  Follow up Holter demonstrated controlled ventricular rate.   He does have a reduced EF and had a negative Lexiscan Myoview.   He returns for follow up.    Since I last saw him he has done well.  He does not feel his atrial fib.  The patient denies any new symptoms such as chest discomfort, neck or arm discomfort. There has been no new shortness of breath, PND or orthopnea. There have been no reported palpitations, presyncope or syncope.     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     Past Medical History:  Diagnosis Date  . Arthritis   . Cancer [facility])    prostate cancer  . Hypertension   . Kidney stones     Past Surgical History:  Procedure Laterality Date  . APPENDECTOMY    . CARDIOVERSION N/A 03/12/2017   Procedure: CARDIOVERSION;  Surgeon: Sanda Klein, MD;  [facility];  Service: Cardiovascular;  Laterality: N/A;  . COLONOSCOPY    . JOINT REPLACEMENT Left    knee  . LITHOTRIPSY    . PROSTATE SURGERY  2002  . REVERSE SHOULDER ARTHROPLASTY Right 01/13/2016   Procedure: RIGHT REVERSE SHOULDER ARTHROPLASTY;  Surgeon: Justice Britain, MD;  Location: [place];  Service: Orthopedics;  Laterality: Right;     Current Outpatient Medications  Medication Sig Dispense Refill  . B Complex-C (SUPER B COMPLEX PO) Take 1 tablet by mouth daily.    . bisoprolol-hydrochlorothiazide (ZIAC) 10-6.25 MG tablet Take 1 tablet by mouth every morning.    Marland Kitchen CARTIA XT 120 MG 24 hr  capsule Take 1 capsule by mouth daily.  0  . Cholecalciferol (VITAMIN D3) 2000 units TABS Take 1 tablet by mouth daily.    . digoxin (LANOXIN) 0.125 MG tablet Take 1 tablet (0.125 mg total) by mouth daily. 90 tablet 3  . Magnesium 250 MG TABS Take 1 tablet by mouth daily.    . Omega-3 Fatty Acids (FISH OIL) 1200 MG CAPS Take 1 capsule by mouth daily.    . ondansetron (ZOFRAN) 4 MG tablet Take 1 tablet (4 mg total) by mouth every 8 (eight) hours as needed for nausea or vomiting. 20 tablet 0  . Potassium Gluconate 550 (90 K) MG TABS Take 1 tablet by mouth daily.    . rivaroxaban (XARELTO) 20 MG TABS tablet Take 1 tablet (20 mg total) by mouth daily with supper. 90 tablet 0  . sacubitril-valsartan (ENTRESTO) 97-103 MG Take 1 tablet by mouth 2 (two) times daily. 60 tablet 11   No current facility-administered medications for this visit.     Allergies:   Patient has no known allergies.    ROS:  Please see the history of present illness.   Otherwise, review of systems are positive for none.   All other systems are reviewed and negative.    PHYSICAL EXAM: VS:  BP 130/86   Pulse 80  Ht 6\' 2"  (1.88 m)   Wt 186 lb (84.4 kg)   BMI 23.88 kg/m  , BMI Body mass index is 23.88 kg/m.  GENERAL:  Well appearing NECK:  No jugular venous distention, waveform within normal limits, carotid upstroke brisk and symmetric, no bruits, no thyromegaly LUNGS:  Clear to auscultation bilaterally CHEST:  Unremarkable HEART:  PMI not displaced or sustained,S1 and S2 within normal limits, no S3, , no clicks, no rubs, no murmurs, irregular ABD:  Flat, positive bowel sounds normal in frequency in pitch, no bruits, no rebound, no guarding, no midline pulsatile mass, no hepatomegaly, no splenomegaly EXT:  2 plus pulses throughout, no edema, no cyanosis no clubbing   EKG:  EKG is not ordered today.   Recent Labs: 03/09/2017: ALT 30; BUN 34; Creatinine, Ser 1.18; Platelets 232; TSH 1.730 03/12/2017: Hemoglobin 16.0;  Potassium 4.8; Sodium 136    Lipid Panel No results found for: CHOL, TRIG, HDL, CHOLHDL, VLDL, LDLCALC, LDLDIRECT    Wt Readings from Last 3 Encounters:  10/17/17 186 lb (84.4 kg)  07/16/17 186 lb (84.4 kg)  05/04/17 190 lb (86.2 kg)      Other studies Reviewed: Additional studies/ records that were reviewed today include: Labs Review of the above records demonstrates:     ASSESSMENT AND PLAN:    ATRIAL FIB:  Ronnie Booth has a CHA2DS2 - VASc score of 2.  He is status post DCCV.  He did not maintain NSR.   He will continue the meds as listed.  The patient  tolerates this rhythm and rate control and anticoagulation.  CARDIOMYOPATHY:   Today I will increase his Entresto to 97-103 bid.  I will check a BMET in two weeks.    MR:  I will follow up with echo after we have had med titration.    HTN:    This is being managed in the context of treating his CHF  DYSLIPIDEMIA:  I did review labs and the LDL was 175 but he does not want to take meds.  He has had pain on statin.    Current medicines are reviewed at length with the patient today.  The patient does not have concerns regarding medicines.  The following changes have been made:   As above  Labs/ tests ordered today include:     Orders Placed This Encounter  Procedures  . Basic Metabolic Panel (BMET)     Disposition:   FU with me in 3 months   Signed, Minus Breeding, MD  10/17/2017 8:59 AM    [place]

## 2017-10-17 ENCOUNTER — Encounter: Payer: Self-pay | Admitting: Cardiology

## 2017-10-17 ENCOUNTER — Ambulatory Visit: Payer: Medicare Other | Admitting: Cardiology

## 2017-10-17 VITALS — BP 130/86 | HR 80 | Ht 74.0 in | Wt 186.0 lb

## 2017-10-17 DIAGNOSIS — Z79899 Other long term (current) drug therapy: Secondary | ICD-10-CM | POA: Diagnosis not present

## 2017-10-17 DIAGNOSIS — I5022 Chronic systolic (congestive) heart failure: Secondary | ICD-10-CM | POA: Diagnosis not present

## 2017-10-17 DIAGNOSIS — I4819 Other persistent atrial fibrillation: Secondary | ICD-10-CM

## 2017-10-17 DIAGNOSIS — I481 Persistent atrial fibrillation: Secondary | ICD-10-CM | POA: Diagnosis not present

## 2017-10-17 DIAGNOSIS — I10 Essential (primary) hypertension: Secondary | ICD-10-CM | POA: Diagnosis not present

## 2017-10-17 DIAGNOSIS — E785 Hyperlipidemia, unspecified: Secondary | ICD-10-CM | POA: Diagnosis not present

## 2017-10-17 MED ORDER — SACUBITRIL-VALSARTAN 97-103 MG PO TABS: 1.0000 | ORAL_TABLET | Freq: Two times a day (BID) | ORAL | 11 refills | Status: DC

## 2017-10-17 NOTE — Patient Instructions (Signed)
Medication Instructions:  INCREASE- Entresto 97/103 mg twice a day  If you need a refill on your cardiac medications before your next appointment, please call your pharmacy.  Labwork: BMP in 2 weeks HERE IN OUR OFFICE AT [facility]  Take the provided lab slips for you to take with you to the lab for you blood draw.    You will NOT need to fast   Testing/Procedures: None Ordered  Follow-Up: Your physician wants you to follow-up in: 3 Months.     Thank you for choosing [facility] at [facility]!!

## 2017-11-13 DIAGNOSIS — Z79899 Other long term (current) drug therapy: Secondary | ICD-10-CM | POA: Diagnosis not present

## 2017-11-13 LAB — BASIC METABOLIC PANEL
BUN/Creatinine Ratio: 24 (ref 10–24)
BUN: 28 mg/dL — AB (ref 8–27)
CHLORIDE: 102 mmol/L (ref 96–106)
CO2: 27 mmol/L (ref 20–29)
CREATININE: 1.18 mg/dL (ref 0.76–1.27)
Calcium: 9.5 mg/dL (ref 8.6–10.2)
GFR calc non Af Amer: 61 mL/min/{1.73_m2} (ref >59)
GFR, EST AFRICAN AMERICAN: 70 mL/min/{1.73_m2} (ref >59)
GLUCOSE: 107 mg/dL — AB (ref 65–99)
Potassium: 5 mmol/L (ref 3.5–5.2)
SODIUM: 141 mmol/L (ref 134–144)

## 2017-12-10 DIAGNOSIS — M1711 Unilateral primary osteoarthritis, right knee: Secondary | ICD-10-CM | POA: Diagnosis not present

## 2018-01-07 DIAGNOSIS — M1711 Unilateral primary osteoarthritis, right knee: Secondary | ICD-10-CM | POA: Diagnosis not present

## 2018-01-16 NOTE — Progress Notes (Signed)
Cardiology Office Note   Date:  01/18/2018   ID:  Ronnie Booth, DOB 1945/11/19, MRN [id number]  PCP:  Orpah Melter, MD  Cardiologist:   Minus Breeding, MD    Chief Complaint  Patient presents with  . Atrial Fibrillation      History of Present Illness: Ronnie Booth is a 72 y.o. male who presents for follow up of atrial fibrillation .   He had DCCV. However, he did not maintain NSR.  Follow up Holter demonstrated controlled ventricular rate.   He does have a reduced EF and had a negative Lexiscan Myoview.   He returns for follow up.  At the last visit I increased the Entresto.    Since I last saw him he has done well.  The patient denies any new symptoms such as chest discomfort, neck or arm discomfort. There has been no new shortness of breath, PND or orthopnea. There have been no reported palpitations, presyncope or syncope.   He is still working as a Dealer.   Past Medical History:  Diagnosis Date  . Arthritis   . Cancer [facility])    prostate cancer  . Hypertension   . Kidney stones     Past Surgical History:  Procedure Laterality Date  . APPENDECTOMY    . CARDIOVERSION N/A 03/12/2017   Procedure: CARDIOVERSION;  Surgeon: Sanda Klein, MD;  [facility];  Service: Cardiovascular;  Laterality: N/A;  . COLONOSCOPY    . JOINT REPLACEMENT Left    knee  . LITHOTRIPSY    . PROSTATE SURGERY  2002  . REVERSE SHOULDER ARTHROPLASTY Right 01/13/2016   Procedure: RIGHT REVERSE SHOULDER ARTHROPLASTY;  Surgeon: Justice Britain, MD;  Location: [place];  Service: Orthopedics;  Laterality: Right;     Current Outpatient Medications  Medication Sig Dispense Refill  . B Complex-C (SUPER B COMPLEX PO) Take 1 tablet by mouth daily.    . bisoprolol-hydrochlorothiazide (ZIAC) 10-6.25 MG tablet Take 1 tablet by mouth every morning.    Marland Kitchen CARTIA XT 120 MG 24 hr capsule Take 1 capsule by mouth daily.  0  . Cholecalciferol (VITAMIN D3) 2000 units TABS Take 1 tablet  by mouth daily.    . digoxin (LANOXIN) 0.125 MG tablet Take 1 tablet (0.125 mg total) by mouth daily. 90 tablet 3  . Magnesium 250 MG TABS Take 1 tablet by mouth daily.    . Omega-3 Fatty Acids (FISH OIL) 1200 MG CAPS Take 1 capsule by mouth daily.    . Potassium Gluconate 550 (90 K) MG TABS Take 1 tablet by mouth daily.    . rivaroxaban (XARELTO) 20 MG TABS tablet Take 1 tablet (20 mg total) by mouth daily with supper. 90 tablet 0  . sacubitril-valsartan (ENTRESTO) 97-103 MG Take 1 tablet by mouth 2 (two) times daily. 60 tablet 11   No current facility-administered medications for this visit.     Allergies:   Patient has no known allergies.    ROS:  Please see the history of present illness.   Otherwise, review of systems are positive for no.   All other systems are reviewed and negative.    PHYSICAL EXAM: VS:  BP 128/82   Pulse 77   Ht 6\' 2"  (1.88 m)   Wt 185 lb 12.8 oz (84.3 kg)   BMI 23.86 kg/m  , BMI Body mass index is 23.86 kg/m.  GENERAL:  Well appearing NECK:  No jugular venous distention, waveform within normal limits, carotid upstroke brisk  and symmetric, no bruits, no thyromegaly LUNGS:  Clear to auscultation bilaterally CHEST:  Unremarkable HEART:  PMI not displaced or sustained,S1 and S2 within normal limits, no S3, no clicks, no rubs, Irregular , no murmurs ABD:  Flat, positive bowel sounds normal in frequency in pitch, no bruits, no rebound, no guarding, no midline pulsatile mass, no hepatomegaly, no splenomegaly EXT:  2 plus pulses throughout, no edema, no cyanosis no clubbing   EKG:  EKG is not ordered today. Atrial fib, rate 77, axis WNL, intervals WNL axis WNL, no acute ST T wave changes. 01/18/2018  Recent Labs: 03/09/2017: ALT 30; Platelets 232; TSH 1.730 03/12/2017: Hemoglobin 16.0 11/13/2017: BUN 28; Creatinine, Ser 1.18; Potassium 5.0; Sodium 141    Lipid Panel No results found for: CHOL, TRIG, HDL, CHOLHDL, VLDL, LDLCALC, LDLDIRECT    Wt Readings  from Last 3 Encounters:  01/18/18 185 lb 12.8 oz (84.3 kg)  10/17/17 186 lb (84.4 kg)  07/16/17 186 lb (84.4 kg)      Other studies Reviewed: Additional studies/ records that were reviewed today include: None Review of the above records demonstrates:     ASSESSMENT AND PLAN:    ATRIAL FIB:  Ronnie Booth has a CHA2DS2 - VASc score of 2.  He is status post DCCV.  He did not maintain NSR.    CARDIOMYOPATHY:   I will follow up an echo.    I will consider adding spironolactone.   MR:    I will follow up an echo which was moderate last year.   HTN:   This is being managed in the context of treating his CHF  DYSLIPIDEMIA:    He cannot take statins.  He was started on another med by his PCP but could not take this and does not know what it was.  He does not want to take other therapies.   Current medicines are reviewed at length with the patient today.  The patient does not have concerns regarding medicines.  The following changes have been made:   None  Labs/ tests ordered today include:    Orders Placed This Encounter  Procedures  . ECHOCARDIOGRAM COMPLETE     Disposition:   FU with me in 12 months   Signed, Minus Breeding, MD  01/18/2018 8:06 AM    [place]

## 2018-01-18 ENCOUNTER — Encounter: Payer: Self-pay | Admitting: Cardiology

## 2018-01-18 ENCOUNTER — Ambulatory Visit: Payer: Medicare Other | Admitting: Cardiology

## 2018-01-18 VITALS — BP 128/82 | HR 77 | Ht 74.0 in | Wt 185.8 lb

## 2018-01-18 DIAGNOSIS — I420 Dilated cardiomyopathy: Secondary | ICD-10-CM | POA: Diagnosis not present

## 2018-01-18 DIAGNOSIS — I10 Essential (primary) hypertension: Secondary | ICD-10-CM | POA: Diagnosis not present

## 2018-01-18 DIAGNOSIS — I481 Persistent atrial fibrillation: Secondary | ICD-10-CM | POA: Diagnosis not present

## 2018-01-18 DIAGNOSIS — I340 Nonrheumatic mitral (valve) insufficiency: Secondary | ICD-10-CM

## 2018-01-18 DIAGNOSIS — E785 Hyperlipidemia, unspecified: Secondary | ICD-10-CM

## 2018-01-18 DIAGNOSIS — I4819 Other persistent atrial fibrillation: Secondary | ICD-10-CM

## 2018-01-18 NOTE — Patient Instructions (Signed)

## 2018-01-22 NOTE — Addendum Note (Signed)
Addended by: Zebedee Iba on: 01/22/2018 01:35 PM   Modules accepted: Orders

## 2018-02-11 ENCOUNTER — Other Ambulatory Visit: Payer: Self-pay

## 2018-02-11 ENCOUNTER — Ambulatory Visit (HOSPITAL_COMMUNITY): Payer: Medicare Other | Attending: Cardiology

## 2018-02-11 DIAGNOSIS — I080 Rheumatic disorders of both mitral and aortic valves: Secondary | ICD-10-CM | POA: Diagnosis not present

## 2018-02-11 DIAGNOSIS — I340 Nonrheumatic mitral (valve) insufficiency: Secondary | ICD-10-CM | POA: Diagnosis not present

## 2018-02-11 DIAGNOSIS — I4891 Unspecified atrial fibrillation: Secondary | ICD-10-CM | POA: Insufficient documentation

## 2018-02-11 DIAGNOSIS — I517 Cardiomegaly: Secondary | ICD-10-CM

## 2018-02-11 DIAGNOSIS — I351 Nonrheumatic aortic (valve) insufficiency: Secondary | ICD-10-CM

## 2018-02-11 DIAGNOSIS — I119 Hypertensive heart disease without heart failure: Secondary | ICD-10-CM | POA: Diagnosis not present

## 2018-02-11 HISTORY — DX: Nonrheumatic mitral (valve) insufficiency: I34.0

## 2018-02-11 HISTORY — DX: Nonrheumatic aortic (valve) insufficiency: I35.1

## 2018-02-11 HISTORY — DX: Cardiomegaly: I51.7

## 2018-04-03 DIAGNOSIS — H2513 Age-related nuclear cataract, bilateral: Secondary | ICD-10-CM | POA: Diagnosis not present

## 2018-04-03 DIAGNOSIS — H43813 Vitreous degeneration, bilateral: Secondary | ICD-10-CM | POA: Diagnosis not present

## 2018-04-03 DIAGNOSIS — H17822 Peripheral opacity of cornea, left eye: Secondary | ICD-10-CM | POA: Diagnosis not present

## 2018-05-24 DIAGNOSIS — Z23 Encounter for immunization: Secondary | ICD-10-CM | POA: Diagnosis not present

## 2018-06-30 ENCOUNTER — Other Ambulatory Visit: Payer: Self-pay | Admitting: Cardiology

## 2018-10-09 ENCOUNTER — Telehealth: Payer: Self-pay

## 2018-10-09 DIAGNOSIS — M1711 Unilateral primary osteoarthritis, right knee: Secondary | ICD-10-CM | POA: Diagnosis not present

## 2018-10-09 NOTE — Telephone Encounter (Signed)
   Primary Cardiologist:James Hochrein, MD  Chart reviewed as part of pre-operative protocol coverage. Because of Ronnie Booth's past medical history and time since last visit, he/she will require a follow-up visit in order to better assess preoperative cardiovascular risk.  Pre-op covering staff: - Please schedule appointment and call patient to inform them. - Please contact requesting surgeon's office via preferred method (i.e, phone, fax) to inform them of need for appointment prior to surgery.  Pharmacy to review anticoagulation.   [place], [place]  10/09/2018, 1:47 PM

## 2018-10-09 NOTE — Telephone Encounter (Signed)
Patient with diagnosis of afib on Xarelto for anticoagulation.    Procedure: Right total knee replacement Date of procedure: TBD  CHADS2-VASc score of  3 (CHF, HTN, AGE, DM2, stroke/tia x 2, CAD, AGE, male)  Per office protocol, patient can hold Xarelto for 3 days prior to procedure.

## 2018-10-09 NOTE — Telephone Encounter (Signed)
1st attempt to reach pt. Left detailed message that pt needs to make an appt before his surgical clearance can be addressed and to call back and make that appt.

## 2018-10-09 NOTE — Telephone Encounter (Signed)
   [facility] Pre-operative Risk Assessment    Request for surgical clearance:  1. What type of surgery is being performed? Right total knee replacement  2. When is this surgery scheduled? TBD   3. What type of clearance is required (medical clearance vs. Pharmacy clearance to hold med vs. Both)? Both  4. Are there any medications that need to be held prior to surgery and how long? Xarelto  5. Practice name and name of physician performing surgery? [place] [facility]   6. What is your office phone number [phone] Attn: Derek Jack   7.   What is your office fax number [phone]  8.   Anesthesia type  Not listed   Kathyrn Lass 10/09/2018, 1:07 PM  _________________________________________________________________   (provider comments below)

## 2018-10-10 NOTE — Telephone Encounter (Signed)
Pt is scheduled to see Jory Sims, NP, 10/14/2018

## 2018-10-13 NOTE — Progress Notes (Signed)
Cardiology Office Note   Date:  10/14/2018   ID:  Jeremyah, Jelley [birth year], MRN [id number]  PCP:  Orpah Melter, MD  Cardiologist:  [facility]  Chief Complaint  Patient presents with  . Pre-op Exam  . Atrial Fibrillation     History of Present Illness: Ronnie Booth is a 73 y.o. male who presents for ongoing assessment and management of atrial fibrillation CHADS VASC Score of 2, and cardiomyopathy.Most recent echo in 01/2018 revealed normal LVEF and severely dilated left atrium. He was last seen in the office on 01/18/2018 and was stable from a cardiac standpoint.   He is here for cardiac evaluation prior to right total knee replacement with Dr.Landau [facility] on date to be determined.   He denies chest pain, DOE, rapid HR, bleeding or fatigue. 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   Past Medical History:  Diagnosis Date  . Arthritis   . Cancer [facility])    prostate cancer  . Hypertension   . Kidney stones     Past Surgical History:  Procedure Laterality Date  . APPENDECTOMY    . CARDIOVERSION N/A 03/12/2017   Procedure: CARDIOVERSION;  Surgeon: Sanda Klein, MD;  [facility];  Service: Cardiovascular;  Laterality: N/A;  . COLONOSCOPY    . JOINT REPLACEMENT Left    knee  . LITHOTRIPSY    . PROSTATE SURGERY  2002  . REVERSE SHOULDER ARTHROPLASTY Right 01/13/2016   Procedure: RIGHT REVERSE SHOULDER ARTHROPLASTY;  Surgeon: Justice Britain, MD;  Location: [place];  Service: Orthopedics;  Laterality: Right;     Current Outpatient Medications  Medication Sig Dispense Refill  . B Complex-C (SUPER B COMPLEX PO) Take 1 tablet by mouth daily.    . bisoprolol-hydrochlorothiazide (ZIAC) 10-6.25 MG tablet Take 1 tablet by mouth every morning.    Marland Kitchen CARTIA XT 120 MG 24 hr capsule Take 1 capsule by mouth daily.  0  . Cholecalciferol (VITAMIN D3) 2000 units TABS Take 1 tablet by mouth daily.    .  diclofenac sodium (VOLTAREN) 1 % GEL Apply 2 g topically daily.     . digoxin (LANOXIN) 0.125 MG tablet TAKE ONE TABLET BY MOUTH DAILY 90 tablet 2  . Magnesium 250 MG TABS Take 1 tablet by mouth daily.    . Omega-3 Fatty Acids (FISH OIL) 1200 MG CAPS Take 1 capsule by mouth daily.    . Potassium Gluconate 550 (90 K) MG TABS Take 1 tablet by mouth daily.    . rivaroxaban (XARELTO) 20 MG TABS tablet Take 1 tablet (20 mg total) by mouth daily with supper. 90 tablet 0  . sacubitril-valsartan (ENTRESTO) 97-103 MG Take 1 tablet by mouth 2 (two) times daily. 60 tablet 11   No current facility-administered medications for this visit.     Allergies:   Patient has no known allergies.    Social History:  The patient  reports that he has never smoked. He has never used smokeless tobacco. He reports current alcohol use. He reports that he does not use drugs.   Family History:  The patient's family history includes Alzheimer's disease in his mother; Lung cancer in his father; Parkinson's disease in his mother.    ROS: All other systems are reviewed and negative. Unless otherwise mentioned in H&P    PHYSICAL EXAM: VS:  BP 132/72   Pulse 85   Ht 6\' 2"  (1.88 m)   Wt 186  lb 9.6 oz (84.6 kg)   SpO2 96%   BMI 23.96 kg/m  , BMI Body mass index is 23.96 kg/m. GEN: Well nourished, well developed, in no acute distress HEENT: normal Neck: no JVD, carotid bruits, or masses Cardiac: IRRR; no murmurs, rubs, or gallops,no edema  Respiratory:  Clear to auscultation bilaterally, normal work of breathing GI: soft, nontender, nondistended, + BS MS: no deformity or atrophy Skin: warm and dry, no rash Neuro:  Strength and sensation are intact Psych: euthymic mood, full affect   EKG:  Atrial fibrillation rate of 85 bpm.   Recent Labs: 11/13/2017: BUN 28; Creatinine, Ser 1.18; Potassium 5.0; Sodium 141    Lipid Panel No results found for: CHOL, TRIG, HDL, CHOLHDL, VLDL, LDLCALC, LDLDIRECT    Wt  Readings from Last 3 Encounters:  10/14/18 186 lb 9.6 oz (84.6 kg)  01/18/18 185 lb 12.8 oz (84.3 kg)  10/17/17 186 lb (84.4 kg)     Other studies Reviewed: Study Conclusions  - Left ventricle: The cavity size was normal. Wall thickness was   increased in a pattern of mild LVH. Systolic function was normal.   The estimated ejection fraction was in the range of 55% to 60%.   There is hypokinesis of the mid-apicalanteroseptal myocardium.   There was no evidence of elevated ventricular filling pressure by   Doppler parameters. - Aortic valve: There was mild regurgitation. - Mitral valve: There was mild regurgitation. - Left atrium: The atrium was severely dilated. Volume/bsa, S: 67.2   ml/m^2. Volume/bsa, ES (1-plane Simpson&'s, A4C): 51.9 ml/m^2. - Right atrium: The atrium was mildly dilated.  Impressions:  - EF is improved as compared to prior ECHO (35%)  ASSESSMENT AND PLAN:  1. Pre-Operative Cardiac Evaluation:  Chart reviewed as part of pre-operative protocol coverage. Given past medical history and time since last visit, based on ACC/AHA guidelines, Ronnie Booth would be at acceptable risk for the planned procedure without further cardiovascular testing. Stop rivaroxaban 48 hours prior to knee surgery, and start back ASAP thereafter.    2. Atrial Fibrillation: Rate is well controlled no changes in his regimen. Continue diltiazem, digoxin.   3. Dyslipidemia: He will continue Fish Oil. He cannot take statins, and does not want to entertain other therapies.   4.Hypertension: On multiple medications. His LVEF is normal now, but will continue Entresto, and Ziac. Not changes peri-operatively.   Current medicines are reviewed at length with the patient today.    Labs/ tests ordered today include: None  Phill Myron. [place] Pugh, ANP, AACC   10/14/2018 12:51 PM    [facility] [place] [address] Office [phone] Fax [phone]

## 2018-10-14 ENCOUNTER — Ambulatory Visit: Payer: Medicare Other | Admitting: Adult Health

## 2018-10-14 ENCOUNTER — Encounter: Payer: Self-pay | Admitting: Adult Health

## 2018-10-14 VITALS — BP 132/72 | HR 85 | Ht 74.0 in | Wt 186.6 lb

## 2018-10-14 DIAGNOSIS — E7800 Pure hypercholesterolemia, unspecified: Secondary | ICD-10-CM

## 2018-10-14 DIAGNOSIS — Z01810 Encounter for preprocedural cardiovascular examination: Secondary | ICD-10-CM

## 2018-10-14 DIAGNOSIS — I4811 Longstanding persistent atrial fibrillation: Secondary | ICD-10-CM

## 2018-10-14 DIAGNOSIS — I10 Essential (primary) hypertension: Secondary | ICD-10-CM | POA: Diagnosis not present

## 2018-10-14 DIAGNOSIS — Z0000 Encounter for general adult medical examination without abnormal findings: Secondary | ICD-10-CM | POA: Diagnosis not present

## 2018-10-14 DIAGNOSIS — Z01818 Encounter for other preprocedural examination: Secondary | ICD-10-CM | POA: Diagnosis not present

## 2018-10-14 DIAGNOSIS — E785 Hyperlipidemia, unspecified: Secondary | ICD-10-CM | POA: Diagnosis not present

## 2018-10-14 DIAGNOSIS — I4891 Unspecified atrial fibrillation: Secondary | ICD-10-CM

## 2018-10-14 HISTORY — DX: Unspecified atrial fibrillation: I48.91

## 2018-10-14 NOTE — Patient Instructions (Addendum)
CLEARED FOR Right total knee replacement @ [facility]  Dr.Joshua Landau   HOLD XARELTO 48 HOURS BEFORE SURGERY Follow-Up: You will need a follow up appointment in MAY 2020.  Please call our office 2 months in advance (MARCH 2020)  to schedule this (MAY 2020)  appointment.  You may see Minus Breeding, MD, Jory Sims, DNP, [place]  or one of the following Advanced Practice Providers on your designated Care Team:  Rosaria Ferries, PA-C, Jory Sims, DNP, AACC   Medication Instructions:  NO CHANGES- Your physician recommends that you continue on your current medications as directed. Please refer to the Current Medication list given to you today. If you need a refill on your cardiac medications before your next appointment, please call your pharmacy. Labwork: When you have labs (blood work) and your tests are completely normal, you will receive your results ONLY by [place] (if you have MyChart) -OR- A paper copy in the mail.  At [facility], you and your health needs are our priority.  As part of our continuing mission to provide you with exceptional heart care, we have created designated Provider Care Teams.  These Care Teams include your primary Cardiologist (physician) and Advanced Practice Providers (APPs -  Physician Assistants and Nurse Practitioners) who all work together to provide you with the care you need, when you need it.  Thank you for choosing [facility] at [facility]!!

## 2018-10-16 DIAGNOSIS — Z8546 Personal history of malignant neoplasm of prostate: Secondary | ICD-10-CM | POA: Diagnosis not present

## 2018-10-29 ENCOUNTER — Encounter (HOSPITAL_COMMUNITY): Payer: Self-pay

## 2018-10-29 NOTE — Patient Instructions (Signed)
Your procedure is scheduled on: Tuesday, November 12, 2018   Surgery Time:  10:26AM-1:04 PM   Report to [facility]  Entrance    Report to admitting at 7:45 AM   Call this number if you have problems the morning of surgery [phone]   Do not eat food or drink liquids :After Midnight.   Brush your teeth the morning of surgery.   Do NOT smoke after Midnight   Take these medicines the morning of surgery with A SIP OF WATER: Cartia, Digoxin                               You may not have any metal on your body including jewelry, and body piercings             Do not wear lotions, powders, perfumes/cologne, or deodorant                           Men may shave face and neck.   Do not bring valuables to the hospital. [place].   Contacts, dentures or bridgework may not be worn into surgery.   Leave suitcase in the car. After surgery it may be brought to your room.    Special Instructions: Bring a copy of your healthcare power of attorney and living will documents         the day of surgery if you haven't scanned them in before.              Please read over the following fact sheets you were given:  [facility] - Preparing for Surgery Before surgery, you can play an important role.  Because skin is not sterile, your skin needs to be as free of germs as possible.  You can reduce the number of germs on your skin by washing with CHG (chlorahexidine gluconate) soap before surgery.  CHG is an antiseptic cleaner which kills germs and bonds with the skin to continue killing germs even after washing. Please DO NOT use if you have an allergy to CHG or antibacterial soaps.  If your skin becomes reddened/irritated stop using the CHG and inform your nurse when you arrive at Short Stay. Do not shave (including legs and underarms) for at least 48 hours prior to the first CHG shower.  You may shave your face/neck.  Please follow  these instructions carefully:  1.  Shower with CHG Soap the night before surgery and the  morning of surgery.  2.  If you choose to wash your hair, wash your hair first as usual with your normal  shampoo.  3.  After you shampoo, rinse your hair and body thoroughly to remove the shampoo.                             4.  Use CHG as you would any other liquid soap.  You can apply chg directly to the skin and wash.  Gently with a scrungie or clean washcloth.  5.  Apply the CHG Soap to your body ONLY FROM THE NECK DOWN.   Do   not use on face/ open  Wound or open sores. Avoid contact with eyes, ears mouth and   genitals (private parts).                       Wash face,  Genitals (private parts) with your normal soap.             6.  Wash thoroughly, paying special attention to the area where your    surgery  will be performed.  7.  Thoroughly rinse your body with warm water from the neck down.  8.  DO NOT shower/wash with your normal soap after using and rinsing off the CHG Soap.                9.  Pat yourself dry with a clean towel.            10.  Wear clean pajamas.            11.  Place clean sheets on your bed the night of your first shower and do not  sleep with pets. Day of Surgery : Do not apply any lotions/deodorants the morning of surgery.  Please wear clean clothes to the hospital/surgery center.  FAILURE TO FOLLOW THESE INSTRUCTIONS MAY RESULT IN THE CANCELLATION OF YOUR SURGERY  PATIENT SIGNATURE_________________________________  NURSE SIGNATURE__________________________________  ________________________________________________________________________

## 2018-10-29 NOTE — Pre-Procedure Instructions (Signed)
Dr. Curly Rim surgical clearance 10/14/2018 in hard chart.  The following are in epic: Cardiac clearance and Last office visit note K. Lawrence NP 10/14/2018 EKG 10/14/2018 ECHO 02/11/2018 Stress test 03/30/2017

## 2018-10-30 ENCOUNTER — Other Ambulatory Visit (HOSPITAL_COMMUNITY): Payer: Self-pay | Admitting: Urology

## 2018-10-30 DIAGNOSIS — C61 Malignant neoplasm of prostate: Secondary | ICD-10-CM

## 2018-10-30 DIAGNOSIS — N5231 Erectile dysfunction following radical prostatectomy: Secondary | ICD-10-CM | POA: Diagnosis not present

## 2018-10-31 ENCOUNTER — Other Ambulatory Visit: Payer: Self-pay

## 2018-10-31 ENCOUNTER — Encounter (HOSPITAL_COMMUNITY)
Admission: RE | Admit: 2018-10-31 | Discharge: 2018-10-31 | Disposition: A | Discharge status: Home / Self Care | Payer: Medicare Other | Source: Ambulatory Visit | Attending: Orthopedic Surgery | Admitting: Orthopedic Surgery

## 2018-10-31 ENCOUNTER — Encounter (HOSPITAL_COMMUNITY): Payer: Self-pay

## 2018-10-31 DIAGNOSIS — Z01812 Encounter for preprocedural laboratory examination: Secondary | ICD-10-CM | POA: Insufficient documentation

## 2018-10-31 HISTORY — DX: Hyperlipidemia, unspecified: E78.5

## 2018-10-31 HISTORY — DX: Nonrheumatic mitral (valve) insufficiency: I34.0

## 2018-10-31 HISTORY — DX: Dyspnea, unspecified: R06.00

## 2018-10-31 HISTORY — DX: Cardiomegaly: I51.7

## 2018-10-31 HISTORY — DX: Unspecified atrial fibrillation: I48.91

## 2018-10-31 HISTORY — DX: Malignant neoplasm of prostate: C61

## 2018-10-31 HISTORY — DX: Nonrheumatic aortic (valve) insufficiency: I35.1

## 2018-10-31 HISTORY — DX: Personal history of urinary calculi: Z87.442

## 2018-10-31 HISTORY — DX: Cardiac murmur, unspecified: R01.1

## 2018-10-31 HISTORY — DX: Cardiomyopathy, unspecified: I42.9

## 2018-10-31 LAB — CBC
HCT: 54 % — ABNORMAL HIGH (ref 39.0–52.0)
Hemoglobin: 16.7 g/dL (ref 13.0–17.0)
MCH: 29.7 pg (ref 26.0–34.0)
MCHC: 30.9 g/dL (ref 30.0–36.0)
MCV: 96.1 fL (ref 80.0–100.0)
Platelets: 231 10*3/uL (ref 150–400)
RBC: 5.62 MIL/uL (ref 4.22–5.81)
RDW: 14 % (ref 11.5–15.5)
WBC: 7.7 10*3/uL (ref 4.0–10.5)
nRBC: 0 % (ref 0.0–0.2)

## 2018-10-31 LAB — BASIC METABOLIC PANEL
Anion gap: 8 (ref 5–15)
BUN: 24 mg/dL — AB (ref 8–23)
CHLORIDE: 105 mmol/L (ref 98–111)
CO2: 27 mmol/L (ref 22–32)
Calcium: 9.3 mg/dL (ref 8.9–10.3)
Creatinine, Ser: 1.06 mg/dL (ref 0.61–1.24)
GFR calc Af Amer: >60 mL/min (ref >60)
GFR calc non Af Amer: >60 mL/min (ref >60)
Glucose, Bld: 115 mg/dL — ABNORMAL HIGH (ref 70–99)
Potassium: 4.4 mmol/L (ref 3.5–5.1)
SODIUM: 140 mmol/L (ref 135–145)

## 2018-10-31 LAB — SURGICAL PCR SCREEN
MRSA, PCR: NEGATIVE
Staphylococcus aureus: NEGATIVE

## 2018-10-31 NOTE — Pre-Procedure Instructions (Signed)
CBC and BMP results 10/31/2018 sent to Dr. Mardelle Matte via epic.  Mr. Pilch stated K. Lawrence NP instructed him to stop Xarelto 48 hours prior to surgery.

## 2018-11-01 ENCOUNTER — Other Ambulatory Visit: Payer: Self-pay | Admitting: Cardiology

## 2018-11-05 ENCOUNTER — Ambulatory Visit (HOSPITAL_COMMUNITY)
Admission: RE | Admit: 2018-11-05 | Discharge: 2018-11-05 | Disposition: A | Discharge status: Home / Self Care | Payer: Medicare Other | Source: Ambulatory Visit | Attending: Urology | Admitting: Urology

## 2018-11-05 ENCOUNTER — Other Ambulatory Visit: Payer: Self-pay

## 2018-11-05 DIAGNOSIS — C61 Malignant neoplasm of prostate: Secondary | ICD-10-CM | POA: Insufficient documentation

## 2018-11-05 MED ORDER — AXUMIN (FLUCICLOVINE F 18) INJECTION
10.8100 | Freq: Once | INTRAVENOUS | Status: AC | PRN
  Administered 2018-11-05: 10.81 via INTRAVENOUS

## 2018-11-07 DIAGNOSIS — C775 Secondary and unspecified malignant neoplasm of intrapelvic lymph nodes: Secondary | ICD-10-CM | POA: Diagnosis not present

## 2018-11-07 DIAGNOSIS — C61 Malignant neoplasm of prostate: Secondary | ICD-10-CM | POA: Diagnosis not present

## 2018-11-12 ENCOUNTER — Inpatient Hospital Stay (HOSPITAL_COMMUNITY): Admission: RE | Admit: 2018-11-12 | Payer: Medicare Other | Source: Home / Self Care | Admitting: Orthopedic Surgery

## 2018-11-12 ENCOUNTER — Encounter (HOSPITAL_COMMUNITY): Admission: RE | Payer: Self-pay | Source: Home / Self Care

## 2018-11-12 DIAGNOSIS — C61 Malignant neoplasm of prostate: Secondary | ICD-10-CM | POA: Diagnosis not present

## 2018-11-12 DIAGNOSIS — Z5111 Encounter for antineoplastic chemotherapy: Secondary | ICD-10-CM | POA: Diagnosis not present

## 2018-11-12 SURGERY — ARTHROPLASTY, KNEE, TOTAL
Anesthesia: Choice | Laterality: Right

## 2018-12-04 DIAGNOSIS — C61 Malignant neoplasm of prostate: Secondary | ICD-10-CM | POA: Diagnosis not present

## 2018-12-12 ENCOUNTER — Other Ambulatory Visit: Payer: Self-pay | Admitting: Urology

## 2018-12-12 DIAGNOSIS — N281 Cyst of kidney, acquired: Secondary | ICD-10-CM | POA: Diagnosis not present

## 2018-12-12 DIAGNOSIS — C775 Secondary and unspecified malignant neoplasm of intrapelvic lymph nodes: Secondary | ICD-10-CM | POA: Diagnosis not present

## 2018-12-12 DIAGNOSIS — M8580 Other specified disorders of bone density and structure, unspecified site: Secondary | ICD-10-CM

## 2018-12-12 DIAGNOSIS — N200 Calculus of kidney: Secondary | ICD-10-CM | POA: Diagnosis not present

## 2018-12-12 DIAGNOSIS — C61 Malignant neoplasm of prostate: Secondary | ICD-10-CM | POA: Diagnosis not present

## 2019-01-22 ENCOUNTER — Telehealth: Payer: Self-pay | Admitting: Cardiology

## 2019-01-22 NOTE — Telephone Encounter (Signed)
smartphone/verbal consent/ my chart via emailed/ pre reg completed

## 2019-01-27 ENCOUNTER — Telehealth: Payer: Medicare Other | Admitting: Cardiology

## 2019-01-27 NOTE — Progress Notes (Signed)
Virtual Visit via Video Note   This visit type was conducted due to national recommendations for restrictions regarding the COVID-19 Pandemic (e.g. social distancing) in an effort to limit this patient's exposure and mitigate transmission in our community.  Due to his co-morbid illnesses, this patient is at least at moderate risk for complications without adequate follow up.  This format is felt to be most appropriate for this patient at this time.  All issues noted in this document were discussed and addressed.  A limited physical exam was performed with this format.  Please refer to the patient's chart for his consent to telehealth for [facility].   Date:  01/28/2019   ID:  Ronnie Booth, DOB [birth year], MRN [id number]  Patient Location: Home Provider Location: Home  PCP:  Orpah Melter, MD  Cardiologist:  Minus Breeding, MD  Electrophysiologist:  None   Evaluation Performed:  Follow-Up Visit  Chief Complaint:  Atrial fib  History of Present Illness:    Ronnie Booth is a 73 y.o. male who presents for ongoing assessment and management of atrial fibrillation CHADS VASC Score of 2, and cardiomyopathy.Most recent echo in 01/2018 revealed normal LVEF and severely dilated left atrium.     He was supposed be replaced but this was canceled.  This is now rescheduled.  He is still working every day as a Dealer.   The patient denies any new symptoms such as chest discomfort, neck or arm discomfort. There has been no new shortness of breath, PND or orthopnea. There have been no reported palpitations, presyncope or syncope.   The patient does not have symptoms concerning for COVID-19 infection (fever, chills, cough, or new shortness of breath).    Past Medical History:  Diagnosis Date  . AR (aortic regurgitation) 02/11/2018   Mild, Noted on ECHO  . Arthritis   . Atrial fibrillation by electrocardiogram ([place]) 10/14/2018  . Cardiomyopathy ([place])   . Dyslipidemia   .  Dyspnea 02/2017   in A. Fib  . Heart murmur    History of murmur in high school, undetected at this time  . History of kidney stones   . Hypertension   . LAE (left atrial enlargement) 02/11/2018   Severly dilated, Noted on ECHO  . LVH (left ventricular hypertrophy) 02/11/2018   Mild, Noted on ECHO  . MR (mitral regurgitation) 02/11/2018   Mild, Noted on ECHO  . Prostate cancer [facility])    Past Surgical History:  Procedure Laterality Date  . APPENDECTOMY     had internal bleeding and had to go back to surgery  . CARDIOVERSION N/A 03/12/2017   Procedure: CARDIOVERSION;  Surgeon: Sanda Klein, MD;  [facility];  Service: Cardiovascular;  Laterality: N/A;  . COLONOSCOPY    . INGUINAL HERNIA REPAIR Left   . JOINT REPLACEMENT Left    knee  . LITHOTRIPSY    . PROSTATE SURGERY  2002  . REVERSE SHOULDER ARTHROPLASTY Right 01/13/2016   Procedure: RIGHT REVERSE SHOULDER ARTHROPLASTY;  Surgeon: Justice Britain, MD;  Location: [place];  Service: Orthopedics;  Laterality: Right;     Prior to Admission medications   Medication Sig Start Date End Date Taking? Authorizing Provider  B Complex-C (SUPER B COMPLEX PO) Take 1 tablet by mouth daily.   Yes [provider]  bisoprolol-hydrochlorothiazide (ZIAC) 10-6.25 MG tablet Take 1 tablet by mouth every morning.   Yes [provider]  CARTIA XT 120 MG 24 hr capsule Take 120 mg by mouth daily.  07/11/17  Yes [provider]  Cholecalciferol (VITAMIN D3) 2000 units TABS Take 2,000 Units by mouth daily.    Yes [provider]  digoxin (LANOXIN) 0.125 MG tablet TAKE ONE TABLET BY MOUTH DAILY Patient taking differently: Take 0.125 mg by mouth daily.  07/01/18  Yes Minus Breeding, MD  ENTRESTO 97-103 MG TAKE ONE TABLET BY MOUTH TWICE A DAY 11/01/18  Yes Minus Breeding, MD  Magnesium 250 MG TABS Take 250 mg by mouth daily.    Yes [provider]  Omega-3 Fatty Acids (FISH OIL) 1200 MG CAPS Take 1,200 mg  by mouth daily.    Yes [provider]  Potassium Gluconate 550 (90 K) MG TABS Take 550 mg by mouth daily.    Yes [provider]  rivaroxaban (XARELTO) 20 MG TABS tablet Take 1 tablet (20 mg total) by mouth daily with supper. 06/20/17  Yes Minus Breeding, MD     Allergies:   Patient has no known allergies.   Social History   Tobacco Use  . Smoking status: Never Smoker  . Smokeless tobacco: Never Used  Substance Use Topics  . Alcohol use: Yes    Comment: occasional  . Drug use: No     Family Hx: The patient's family history includes Alzheimer's disease in his mother; Lung cancer in his father; Parkinson's disease in his mother.  ROS:   Please see the history of present illness.      Prior CV studies:   The following studies were reviewed today:   Labs/Other Tests and Data Reviewed:    EKG:  No ECG reviewed.  Recent Labs: 10/31/2018: BUN 24; Creatinine, Ser 1.06; Hemoglobin 16.7; Platelets 231; Potassium 4.4; Sodium 140   Recent Lipid Panel No results found for: CHOL, TRIG, HDL, CHOLHDL, LDLCALC, LDLDIRECT  Wt Readings from Last 3 Encounters:  01/28/19 181 lb (82.1 kg)  10/31/18 184 lb 2 oz (83.5 kg)  10/14/18 186 lb 9.6 oz (84.6 kg)     Objective:    Vital Signs:  BP (!) 142/82   Pulse 82   Ht 6\' 2"  (1.88 m)   Wt 181 lb (82.1 kg)   BMI 23.24 kg/m    VITAL SIGNS:  reviewed  ASSESSMENT & PLAN:     Pre-Operative Cardiac Evaluation:   According to ACC/AHA guidelines the patient is at acceptable risk for the planned procedure.  No further cardiovascular testing is suggested.  He will stop his Xarelto 48 hours prior to the procedure and resume according to the operating physician as soon as possible afterwards.   Atrial Fibrillation: He has good rate control.  I did review the previous EKG.  He is in permanent atrial fibrillation.  Continue the meds as listed.  Dyslipidemia: Triglycerides are too high but he is unable to take statins.  He  will continue with fish oil.  No change in therapy.  Hypertension: Blood pressures well controlled.  He will continue the meds as listed.  COVID-19 Education: The signs and symptoms of COVID-19 were discussed with the patient and how to seek care for testing (follow up with PCP or arrange E-visit).  The importance of social distancing was discussed today.  Time:   Today, I have spent 16 minutes with the patient with telehealth technology discussing the above problems.     Medication Adjustments/Labs and Tests Ordered: Current medicines are reviewed at length with the patient today.  Concerns regarding medicines are outlined above.   Tests Ordered: No orders of the  defined types were placed in this encounter.   Medication Changes: No orders of the defined types were placed in this encounter.   Disposition:  Follow up with me in one year  Signed, Minus Breeding, MD  01/28/2019 9:25 AM    [place]

## 2019-01-28 ENCOUNTER — Encounter: Payer: Self-pay | Admitting: Cardiology

## 2019-01-28 ENCOUNTER — Telehealth (INDEPENDENT_AMBULATORY_CARE_PROVIDER_SITE_OTHER): Payer: Medicare Other | Admitting: Cardiology

## 2019-01-28 VITALS — BP 142/82 | HR 82 | Ht 74.0 in | Wt 181.0 lb

## 2019-01-28 DIAGNOSIS — I4821 Permanent atrial fibrillation: Secondary | ICD-10-CM

## 2019-01-28 DIAGNOSIS — I10 Essential (primary) hypertension: Secondary | ICD-10-CM

## 2019-01-28 DIAGNOSIS — Z01810 Encounter for preprocedural cardiovascular examination: Secondary | ICD-10-CM

## 2019-01-28 DIAGNOSIS — E785 Hyperlipidemia, unspecified: Secondary | ICD-10-CM

## 2019-01-28 DIAGNOSIS — Z7189 Other specified counseling: Secondary | ICD-10-CM

## 2019-01-28 NOTE — Patient Instructions (Signed)

## 2019-02-17 NOTE — Patient Instructions (Addendum)
YOU NEED TO HAVE A COVID 19 TEST ON_______Friday, July 3rd_______, THIS TEST MUST BE DONE BEFORE SURGERY, COME TO [facility] ENTRANCE. ONCE YOUR COVID TEST IS COMPLETED, PLEASE BEGIN THE QUARANTINE INSTRUCTIONS AS OUTLINED IN YOUR HANDOUT.                YAMAN GRAUBERGER    Your procedure is scheduled on: 02-25-2019  Report to [facility] Main  Entrance    Report to admitting at 1035 AM      Call this number if you have problems the morning of surgery [phone]    Remember:  [place], NO [place].    Do not eat food After Midnight. YOU MAY HAVE CLEAR LIQUIDS FROM MIDNIGHT UNTIL 10:05AM. At 10:05AM Please finish the prescribed Pre-Surgery ENSURE drink. Nothing by mouth after you finish the ENSURE drink !     CLEAR LIQUID DIET   Foods Allowed                                                                     Foods Excluded  Coffee and tea, regular and decaf                             liquids that you cannot  Plain Jell-O in any flavor                                             see through such as: Fruit ices (not with fruit pulp)                                     milk, soups, orange juice  Iced Popsicles                                    All solid food Carbonated beverages, regular and diet                                    Cranberry, grape and apple juices Sports drinks like Gatorade Lightly seasoned clear broth or consume(fat free) Sugar, honey syrup  Sample Menu Breakfast                                Lunch                                     Supper Cranberry juice                    Beef broth  Chicken broth Jell-O                                     Grape juice                           Apple juice Coffee or tea                        Jell-O                                      Popsicle                                                 Coffee or tea                        Coffee or tea  _____________________________________________________________________    Take these medicines the morning of surgery with A SIP OF WATER: Digoxin, Cartia XT                                You may not have any metal on your body including hair pins and              piercings  Do not wear jewelry, make-up, lotions, powders or perfumes, deodorant                        Men may shave face and neck.   Do not bring valuables to the hospital. [place].  Contacts, dentures or bridgework may not be worn into surgery.  Leave suitcase in the car. After surgery it may be brought to your room.     _____________________________________________________________________             [facility] - Preparing for Surgery Before surgery, you can play an important role.  Because skin is not sterile, your skin needs to be as free of germs as possible.  You can reduce the number of germs on your skin by washing with CHG (chlorahexidine gluconate) soap before surgery.  CHG is an antiseptic cleaner which kills germs and bonds with the skin to continue killing germs even after washing. Please DO NOT use if you have an allergy to CHG or antibacterial soaps.  If your skin becomes reddened/irritated stop using the CHG and inform your nurse when you arrive at Short Stay. Do not shave (including legs and underarms) for at least 48 hours prior to the first CHG shower.  You may shave your face/neck. Please follow these instructions carefully:  1.  Shower with CHG Soap the night before surgery and the  morning of Surgery.  2.  If you choose to wash your hair, wash your hair first as usual with your  normal  shampoo.  3.  After you shampoo, rinse your hair and body thoroughly to remove the  shampoo.  4.  Use CHG as you would any other liquid soap.  You can apply chg directly  to the skin and  wash                       Gently with a scrungie or clean washcloth.  5.  Apply the CHG Soap to your body ONLY FROM THE NECK DOWN.   Do not use on face/ open                           Wound or open sores. Avoid contact with eyes, ears mouth and genitals (private parts).                       Wash face,  Genitals (private parts) with your normal soap.             6.  Wash thoroughly, paying special attention to the area where your surgery  will be performed.  7.  Thoroughly rinse your body with warm water from the neck down.  8.  DO NOT shower/wash with your normal soap after using and rinsing off  the CHG Soap.                9.  Pat yourself dry with a clean towel.            10.  Wear clean pajamas.            11.  Place clean sheets on your bed the night of your first shower and do not  sleep with pets. Day of Surgery : Do not apply any lotions/deodorants the morning of surgery.  Please wear clean clothes to the hospital/surgery center.  FAILURE TO FOLLOW THESE INSTRUCTIONS MAY RESULT IN THE CANCELLATION OF YOUR SURGERY PATIENT SIGNATURE_________________________________  NURSE SIGNATURE__________________________________  ________________________________________________________________________   Adam Phenix  An incentive spirometer is a tool that can help keep your lungs clear and active. This tool measures how well you are filling your lungs with each breath. Taking long deep breaths may help reverse or decrease the chance of developing breathing (pulmonary) problems (especially infection) following:  A long period of time when you are unable to move or be active. BEFORE THE PROCEDURE   If the spirometer includes an indicator to show your best effort, your nurse or respiratory therapist will set it to a desired goal.  If possible, sit up straight or lean slightly forward. Try not to slouch.  Hold the incentive spirometer in an upright position. INSTRUCTIONS FOR USE   1. Sit on the edge of your bed if possible, or sit up as far as you can in bed or on a chair. 2. Hold the incentive spirometer in an upright position. 3. Breathe out normally. 4. Place the mouthpiece in your mouth and seal your lips tightly around it. 5. Breathe in slowly and as deeply as possible, raising the piston or the ball toward the top of the column. 6. Hold your breath for 3-5 seconds or for as long as possible. Allow the piston or ball to fall to the bottom of the column. 7. Remove the mouthpiece from your mouth and breathe out normally. 8. Rest for a few seconds and repeat Steps 1 through 7 at least 10 times every 1-2 hours when you are awake. Take your time and take a few normal breaths between deep breaths. 9. The spirometer may include an indicator to  show your best effort. Use the indicator as a goal to work toward during each repetition. 10. After each set of 10 deep breaths, practice coughing to be sure your lungs are clear. If you have an incision (the cut made at the time of surgery), support your incision when coughing by placing a pillow or rolled up towels firmly against it. Once you are able to get out of bed, walk around indoors and cough well. You may stop using the incentive spirometer when instructed by your caregiver.  RISKS AND COMPLICATIONS  Take your time so you do not get dizzy or light-headed.  If you are in pain, you may need to take or ask for pain medication before doing incentive spirometry. It is harder to take a deep breath if you are having pain. AFTER USE  Rest and breathe slowly and easily.  It can be helpful to keep track of a log of your progress. Your caregiver can provide you with a simple table to help with this. If you are using the spirometer at home, follow these instructions: [place] IF:   You are having difficultly using the spirometer.  You have trouble using the spirometer as often as instructed.  Your pain medication is  not giving enough relief while using the spirometer.  You develop fever of 100.5 F (38.1 C) or higher. SEEK IMMEDIATE MEDICAL CARE IF:   You cough up bloody sputum that had not been present before.  You develop fever of 102 F (38.9 C) or greater.  You develop worsening pain at or near the incision site. MAKE SURE YOU:   Understand these instructions.  Will watch your condition.  Will get help right away if you are not doing well or get worse. Document Released: 12/18/2006 Document Revised: 10/30/2011 Document Reviewed: 02/18/2007 [facility] Patient Information 2014 [place], [place].   ________________________________________________________________________

## 2019-02-18 ENCOUNTER — Encounter (HOSPITAL_COMMUNITY)
Admission: RE | Admit: 2019-02-18 | Discharge: 2019-02-18 | Disposition: A | Discharge status: Home / Self Care | Payer: Medicare Other | Source: Ambulatory Visit | Attending: Orthopedic Surgery | Admitting: Orthopedic Surgery

## 2019-02-18 ENCOUNTER — Encounter (HOSPITAL_COMMUNITY): Payer: Self-pay

## 2019-02-18 ENCOUNTER — Other Ambulatory Visit: Payer: Self-pay

## 2019-02-18 DIAGNOSIS — Z1159 Encounter for screening for other viral diseases: Secondary | ICD-10-CM | POA: Diagnosis not present

## 2019-02-18 DIAGNOSIS — Z01812 Encounter for preprocedural laboratory examination: Secondary | ICD-10-CM | POA: Diagnosis not present

## 2019-02-18 DIAGNOSIS — M1711 Unilateral primary osteoarthritis, right knee: Secondary | ICD-10-CM | POA: Insufficient documentation

## 2019-02-18 DIAGNOSIS — I10 Essential (primary) hypertension: Secondary | ICD-10-CM | POA: Diagnosis not present

## 2019-02-18 DIAGNOSIS — Z7901 Long term (current) use of anticoagulants: Secondary | ICD-10-CM | POA: Insufficient documentation

## 2019-02-18 DIAGNOSIS — E785 Hyperlipidemia, unspecified: Secondary | ICD-10-CM | POA: Diagnosis not present

## 2019-02-18 DIAGNOSIS — Z8546 Personal history of malignant neoplasm of prostate: Secondary | ICD-10-CM | POA: Insufficient documentation

## 2019-02-18 DIAGNOSIS — Z79899 Other long term (current) drug therapy: Secondary | ICD-10-CM | POA: Insufficient documentation

## 2019-02-18 DIAGNOSIS — I4891 Unspecified atrial fibrillation: Secondary | ICD-10-CM | POA: Insufficient documentation

## 2019-02-18 HISTORY — DX: Reserved for inherently not codable concepts without codable children: IMO0001

## 2019-02-18 LAB — CBC
HCT: 46.1 % (ref 39.0–52.0)
Hemoglobin: 14.6 g/dL (ref 13.0–17.0)
MCH: 30.1 pg (ref 26.0–34.0)
MCHC: 31.7 g/dL (ref 30.0–36.0)
MCV: 95.1 fL (ref 80.0–100.0)
Platelets: 197 10*3/uL (ref 150–400)
RBC: 4.85 MIL/uL (ref 4.22–5.81)
RDW: 13.4 % (ref 11.5–15.5)
WBC: 5.3 10*3/uL (ref 4.0–10.5)
nRBC: 0 % (ref 0.0–0.2)

## 2019-02-18 LAB — BASIC METABOLIC PANEL
Anion gap: 8 (ref 5–15)
BUN: 27 mg/dL — ABNORMAL HIGH (ref 8–23)
CO2: 27 mmol/L (ref 22–32)
Calcium: 9.1 mg/dL (ref 8.9–10.3)
Chloride: 106 mmol/L (ref 98–111)
Creatinine, Ser: 1 mg/dL (ref 0.61–1.24)
GFR calc Af Amer: >60 mL/min (ref >60)
GFR calc non Af Amer: >60 mL/min (ref >60)
Glucose, Bld: 90 mg/dL (ref 70–99)
Potassium: 4.4 mmol/L (ref 3.5–5.1)
Sodium: 141 mmol/L (ref 135–145)

## 2019-02-18 LAB — SURGICAL PCR SCREEN
MRSA, PCR: NEGATIVE
Staphylococcus aureus: NEGATIVE

## 2019-02-19 NOTE — Progress Notes (Signed)
Anesthesia Chart Review   Case: [id number] Date/Time: 02/25/19 1253   Procedure: TOTAL KNEE ARTHROPLASTY (Right )   Anesthesia type: Choice   Pre-op diagnosis: djd right knee   Location: [facility]   Surgeon: [name], MD      DISCUSSION:72 y.o. never smoker with h/o HTN, A-fib, dysliipidemia, prostate cancer right knee DJD scheduled for above procedure 02/25/19 with Dr. [name].    Pt last seen by cardiologist, Dr. Minus Breeding, via telemedicine 01/28/2019.  Per note, "According to ACC/AHA guidelines the patient is at acceptable risk for the planned procedure.  No further cardiovascular testing is suggested.  He will stop his Xarelto 48 hours prior to the procedure and resume according to the operating physician as soon as possible afterwards."  Anticipate pt can proceed with planned procedure barring acute status change.   VS: BP (!) 135/95   Pulse 79   Temp 36.7 C (Oral)   Resp 16   Ht 6\' 2"  (1.88 m)   SpO2 97%   BMI 23.24 kg/m   PROVIDERS: Orpah Melter, MD is PCP   Minus Breeding, MD is Cardiologist  LABS: Labs reviewed: Acceptable for surgery. (all labs ordered are listed, but only abnormal results are displayed)  Labs Reviewed  BASIC METABOLIC PANEL - Abnormal; Notable for the following components:      Result Value   BUN 27 (*)    All other components within normal limits  SURGICAL PCR SCREEN  CBC     IMAGES:   EKG: 10/14/2018 Rate 85 bpm Atrial fibrillation   CV: Echo 02/11/2018 Study Conclusions  - Left ventricle: The cavity size was normal. Wall thickness was   increased in a pattern of mild LVH. Systolic function was normal.   The estimated ejection fraction was in the range of 55% to 60%.   There is hypokinesis of the mid-apicalanteroseptal myocardium.   There was no evidence of elevated ventricular filling pressure by   Doppler parameters. - Aortic valve: There was mild regurgitation. - Mitral valve: There was mild  regurgitation. - Left atrium: The atrium was severely dilated. Volume/bsa, S: 67.2   ml/m^2. Volume/bsa, ES (1-plane Simpson&'s, A4C): 51.9 ml/m^2. - Right atrium: The atrium was mildly dilated.  Impressions:  - EF is improved as compared to prior ECHO (35%)  Stress Test 03/30/17  There was no ST segment deviation noted during stress.  The study is normal.  This is a low risk study.   Normal pharmacologic nuclear stress test with no evidence for prior infarct or ischemia. Past Medical History:  Diagnosis Date  . AR (aortic regurgitation) 02/11/2018   Mild, Noted on ECHO  . Arthritis   . Atrial fibrillation by electrocardiogram ([place]) 10/14/2018  . Cardiomyopathy ([place])   . Dyslipidemia   . Dyspnea 02/2017   in A. Fib  . Heart murmur    History of murmur in high school, undetected at this time  . History of kidney stones   . Hypertension   . LAE (left atrial enlargement) 02/11/2018   Severly dilated, Noted on ECHO  . LVH (left ventricular hypertrophy) 02/11/2018   Mild, Noted on ECHO  . Medical history reviewed with no changes   . MR (mitral regurgitation) 02/11/2018   Mild, Noted on ECHO  . Prostate cancer [facility])     Past Surgical History:  Procedure Laterality Date  . APPENDECTOMY     had internal bleeding and had to go back to surgery  . CARDIOVERSION  N/A 03/12/2017   Procedure: CARDIOVERSION;  Surgeon: Sanda Klein, MD;  Location: [facility];  Service: Cardiovascular;  Laterality: N/A;  . COLONOSCOPY    . INGUINAL HERNIA REPAIR Left   . JOINT REPLACEMENT Left    knee  . LITHOTRIPSY    . PROSTATE SURGERY  2002  . REVERSE SHOULDER ARTHROPLASTY Right 01/13/2016   Procedure: RIGHT REVERSE SHOULDER ARTHROPLASTY;  Surgeon: Justice Britain, MD;  Location: [place];  Service: Orthopedics;  Laterality: Right;    MEDICATIONS: . diclofenac sodium (VOLTAREN) 1 % GEL  . B Complex-C (SUPER B COMPLEX PO)  . bisoprolol-hydrochlorothiazide (ZIAC) 10-6.25 MG tablet  . CARTIA  XT 120 MG 24 hr capsule  . Cholecalciferol (VITAMIN D3) 2000 units TABS  . digoxin (LANOXIN) 0.125 MG tablet  . ENTRESTO 97-103 MG  . Magnesium 250 MG TABS  . Omega-3 Fatty Acids (FISH OIL) 1200 MG CAPS  . Potassium Gluconate 550 (90 K) MG TABS  . rivaroxaban (XARELTO) 20 MG TABS tablet   No current facility-administered medications for this encounter.     Maia Plan [facility] Pre-Surgical Testing [phone] 02/19/19  10:24 AM

## 2019-02-19 NOTE — Anesthesia Preprocedure Evaluation (Addendum)
Anesthesia Evaluation  Patient identified by MRN, date of birth, ID band Patient awake    Reviewed: Allergy & Precautions, NPO status , Patient's Chart, lab work & pertinent test results  Airway Mallampati: II  TM Distance: >3 FB Neck ROM: Full    Dental no notable dental hx.    Pulmonary neg pulmonary ROS,    Pulmonary exam normal breath sounds clear to auscultation       Cardiovascular hypertension, + dysrhythmias Atrial Fibrillation + Valvular Problems/Murmurs AI  Rhythm:Irregular Rate:Normal + Systolic murmurs    Neuro/Psych negative neurological ROS  negative psych ROS   GI/Hepatic negative GI ROS, Neg liver ROS,   Endo/Other  negative endocrine ROS  Renal/GU negative Renal ROS  negative genitourinary   Musculoskeletal negative musculoskeletal ROS (+)   Abdominal   Peds negative pediatric ROS (+)  Hematology negative hematology ROS (+)   Anesthesia Other Findings   Reproductive/Obstetrics negative OB ROS                            Anesthesia Physical Anesthesia Plan  ASA: III  Anesthesia Plan: General   Post-op Pain Management:  Regional for Post-op pain   Induction: Intravenous  PONV Risk Score and Plan: 2 and Ondansetron, Dexamethasone and Treatment may vary due to age or medical condition  Airway Management Planned: LMA  Additional Equipment:   Intra-op Plan:   Post-operative Plan: Extubation in OR  Informed Consent: I have reviewed the patients History and Physical, chart, labs and discussed the procedure including the risks, benefits and alternatives for the proposed anesthesia with the patient or authorized representative who has indicated his/her understanding and acceptance.     Dental advisory given  Plan Discussed with: CRNA and Surgeon  Anesthesia Plan Comments: (See PAT note 02/18/2019, Konrad Felix, PA-C  Only 2 days post xarelto)        Anesthesia Quick Evaluation

## 2019-02-20 DIAGNOSIS — C61 Malignant neoplasm of prostate: Secondary | ICD-10-CM | POA: Diagnosis not present

## 2019-02-21 ENCOUNTER — Other Ambulatory Visit (HOSPITAL_COMMUNITY)
Admission: RE | Admit: 2019-02-21 | Discharge: 2019-02-21 | Disposition: A | Discharge status: Home / Self Care | Payer: Medicare Other | Source: Ambulatory Visit | Attending: Orthopedic Surgery | Admitting: Orthopedic Surgery

## 2019-02-21 DIAGNOSIS — Z79899 Other long term (current) drug therapy: Secondary | ICD-10-CM | POA: Diagnosis not present

## 2019-02-21 DIAGNOSIS — E785 Hyperlipidemia, unspecified: Secondary | ICD-10-CM | POA: Diagnosis not present

## 2019-02-21 DIAGNOSIS — I4891 Unspecified atrial fibrillation: Secondary | ICD-10-CM | POA: Diagnosis not present

## 2019-02-21 DIAGNOSIS — Z01812 Encounter for preprocedural laboratory examination: Secondary | ICD-10-CM | POA: Diagnosis not present

## 2019-02-21 DIAGNOSIS — Z1159 Encounter for screening for other viral diseases: Secondary | ICD-10-CM | POA: Diagnosis not present

## 2019-02-21 DIAGNOSIS — Z7901 Long term (current) use of anticoagulants: Secondary | ICD-10-CM | POA: Diagnosis not present

## 2019-02-21 DIAGNOSIS — Z8546 Personal history of malignant neoplasm of prostate: Secondary | ICD-10-CM | POA: Diagnosis not present

## 2019-02-21 DIAGNOSIS — M1711 Unilateral primary osteoarthritis, right knee: Secondary | ICD-10-CM | POA: Diagnosis not present

## 2019-02-21 DIAGNOSIS — I10 Essential (primary) hypertension: Secondary | ICD-10-CM | POA: Diagnosis not present

## 2019-02-21 LAB — SARS CORONAVIRUS 2 (TAT 6-24 HRS): SARS Coronavirus 2: NEGATIVE

## 2019-02-25 ENCOUNTER — Inpatient Hospital Stay (HOSPITAL_COMMUNITY)
Admission: RE | Admit: 2019-02-25 | Discharge: 2019-02-27 | DRG: 470 | Disposition: A | Discharge status: Home Health | Payer: Medicare Other | Attending: Orthopedic Surgery | Admitting: Orthopedic Surgery

## 2019-02-25 ENCOUNTER — Encounter (HOSPITAL_COMMUNITY)
Admission: RE | Disposition: A | Discharge status: Home Health | Payer: Self-pay | Source: Home / Self Care | Attending: Orthopedic Surgery

## 2019-02-25 ENCOUNTER — Encounter (HOSPITAL_COMMUNITY): Payer: Self-pay | Admitting: Anesthesiology

## 2019-02-25 ENCOUNTER — Inpatient Hospital Stay (HOSPITAL_COMMUNITY): Payer: Medicare Other | Admitting: Physician Assistant

## 2019-02-25 ENCOUNTER — Inpatient Hospital Stay (HOSPITAL_COMMUNITY): Payer: Medicare Other

## 2019-02-25 ENCOUNTER — Inpatient Hospital Stay (HOSPITAL_COMMUNITY): Payer: Medicare Other | Admitting: Anesthesiology

## 2019-02-25 ENCOUNTER — Other Ambulatory Visit: Payer: Self-pay

## 2019-02-25 DIAGNOSIS — G8918 Other acute postprocedural pain: Secondary | ICD-10-CM | POA: Diagnosis not present

## 2019-02-25 DIAGNOSIS — I5022 Chronic systolic (congestive) heart failure: Secondary | ICD-10-CM | POA: Diagnosis not present

## 2019-02-25 DIAGNOSIS — M1711 Unilateral primary osteoarthritis, right knee: Secondary | ICD-10-CM | POA: Diagnosis not present

## 2019-02-25 DIAGNOSIS — Z96651 Presence of right artificial knee joint: Secondary | ICD-10-CM

## 2019-02-25 DIAGNOSIS — I4891 Unspecified atrial fibrillation: Secondary | ICD-10-CM | POA: Diagnosis not present

## 2019-02-25 DIAGNOSIS — I219 Acute myocardial infarction, unspecified: Secondary | ICD-10-CM | POA: Diagnosis not present

## 2019-02-25 DIAGNOSIS — Z96659 Presence of unspecified artificial knee joint: Secondary | ICD-10-CM

## 2019-02-25 DIAGNOSIS — T84498A Other mechanical complication of other internal orthopedic devices, implants and grafts, initial encounter: Secondary | ICD-10-CM | POA: Diagnosis not present

## 2019-02-25 DIAGNOSIS — M1731 Unilateral post-traumatic osteoarthritis, right knee: Secondary | ICD-10-CM | POA: Diagnosis not present

## 2019-02-25 DIAGNOSIS — I110 Hypertensive heart disease with heart failure: Secondary | ICD-10-CM | POA: Diagnosis not present

## 2019-02-25 DIAGNOSIS — Z96611 Presence of right artificial shoulder joint: Secondary | ICD-10-CM | POA: Diagnosis not present

## 2019-02-25 DIAGNOSIS — Z471 Aftercare following joint replacement surgery: Secondary | ICD-10-CM | POA: Diagnosis not present

## 2019-02-25 HISTORY — PX: TOTAL KNEE ARTHROPLASTY: SHX125

## 2019-02-25 HISTORY — DX: Unilateral primary osteoarthritis, right knee: M17.11

## 2019-02-25 SURGERY — ARTHROPLASTY, KNEE, TOTAL
Anesthesia: General | Site: Knee | Laterality: Right

## 2019-02-25 MED ORDER — SODIUM CHLORIDE 0.9 % IR SOLN
Status: DC | PRN
  Administered 2019-02-25: 1000 mL

## 2019-02-25 MED ORDER — SACUBITRIL-VALSARTAN 97-103 MG PO TABS
1.0000 | ORAL_TABLET | Freq: Two times a day (BID) | ORAL | Status: DC
  Administered 2019-02-25 – 2019-02-27 (×4): 1 via ORAL
  Filled 2019-02-25 (×4): qty 1

## 2019-02-25 MED ORDER — HYDROMORPHONE HCL 2 MG/ML IJ SOLN
INTRAMUSCULAR | Status: AC
  Filled 2019-02-25: qty 1

## 2019-02-25 MED ORDER — ROPIVACAINE HCL 7.5 MG/ML IJ SOLN
INTRAMUSCULAR | Status: DC | PRN
  Administered 2019-02-25: 20 mL via PERINEURAL

## 2019-02-25 MED ORDER — BISOPROLOL-HYDROCHLOROTHIAZIDE 10-6.25 MG PO TABS
1.0000 | ORAL_TABLET | Freq: Every day | ORAL | Status: DC
  Administered 2019-02-26 – 2019-02-27 (×2): 1 via ORAL
  Filled 2019-02-25 (×2): qty 1

## 2019-02-25 MED ORDER — METHOCARBAMOL 500 MG IVPB - SIMPLE MED
INTRAVENOUS | Status: AC
  Filled 2019-02-25: qty 50

## 2019-02-25 MED ORDER — TRANEXAMIC ACID-NACL 1000-0.7 MG/100ML-% IV SOLN
1000.0000 mg | INTRAVENOUS | Status: AC
  Administered 2019-02-25: 1000 mg via INTRAVENOUS
  Filled 2019-02-25: qty 100

## 2019-02-25 MED ORDER — ZOLPIDEM TARTRATE 5 MG PO TABS: 5.0000 mg | ORAL_TABLET | Freq: Every evening | ORAL | Status: DC | PRN

## 2019-02-25 MED ORDER — POTASSIUM CHLORIDE IN NACL 20-0.45 MEQ/L-% IV SOLN
INTRAVENOUS | Status: DC
  Administered 2019-02-25: 21:00:00 via INTRAVENOUS
  Filled 2019-02-25 (×2): qty 1000

## 2019-02-25 MED ORDER — METOCLOPRAMIDE HCL 5 MG/ML IJ SOLN: 5.0000 mg | Freq: Three times a day (TID) | INTRAMUSCULAR | Status: DC | PRN

## 2019-02-25 MED ORDER — PROPOFOL 10 MG/ML IV BOLUS
INTRAVENOUS | Status: DC | PRN
  Administered 2019-02-25: 200 mg via INTRAVENOUS

## 2019-02-25 MED ORDER — MIDAZOLAM HCL 2 MG/2ML IJ SOLN
1.0000 mg | Freq: Once | INTRAMUSCULAR | Status: AC
  Administered 2019-02-25: 1 mg via INTRAVENOUS
  Filled 2019-02-25: qty 2

## 2019-02-25 MED ORDER — KETOROLAC TROMETHAMINE 30 MG/ML IJ SOLN
INTRAMUSCULAR | Status: DC | PRN
  Administered 2019-02-25: 30 mg via INTRAMUSCULAR

## 2019-02-25 MED ORDER — CEFAZOLIN SODIUM-DEXTROSE 2-4 GM/100ML-% IV SOLN
2.0000 g | INTRAVENOUS | Status: AC
  Administered 2019-02-25: 2 g via INTRAVENOUS
  Filled 2019-02-25: qty 100

## 2019-02-25 MED ORDER — BISACODYL 10 MG RE SUPP: 10.0000 mg | Freq: Every day | RECTAL | Status: DC | PRN

## 2019-02-25 MED ORDER — VITAMIN D3 50 MCG (2000 UT) PO TABS: 2000.0000 [IU] | ORAL_TABLET | Freq: Every day | ORAL | Status: DC

## 2019-02-25 MED ORDER — POVIDONE-IODINE 10 % EX SWAB
2.0000 "application " | Freq: Once | CUTANEOUS | Status: AC
  Administered 2019-02-25: 2 via TOPICAL

## 2019-02-25 MED ORDER — LIDOCAINE 2% (20 MG/ML) 5 ML SYRINGE
INTRAMUSCULAR | Status: DC | PRN
  Administered 2019-02-25: 75 mg via INTRAVENOUS

## 2019-02-25 MED ORDER — OXYCODONE HCL 5 MG PO TABS
5.0000 mg | ORAL_TABLET | ORAL | Status: DC | PRN
  Administered 2019-02-25: 10 mg via ORAL
  Administered 2019-02-26: 5 mg via ORAL
  Administered 2019-02-26 – 2019-02-27 (×3): 10 mg via ORAL
  Filled 2019-02-25 (×4): qty 2
  Filled 2019-02-25: qty 1

## 2019-02-25 MED ORDER — PROPOFOL 10 MG/ML IV BOLUS
INTRAVENOUS | Status: AC
  Filled 2019-02-25: qty 20

## 2019-02-25 MED ORDER — ACETAMINOPHEN 500 MG PO TABS
1000.0000 mg | ORAL_TABLET | Freq: Four times a day (QID) | ORAL | Status: AC
  Administered 2019-02-25 – 2019-02-26 (×3): 1000 mg via ORAL
  Filled 2019-02-25 (×3): qty 2

## 2019-02-25 MED ORDER — MAGNESIUM CITRATE PO SOLN: 1.0000 | Freq: Once | ORAL | Status: DC | PRN

## 2019-02-25 MED ORDER — FENTANYL CITRATE (PF) 100 MCG/2ML IJ SOLN
INTRAMUSCULAR | Status: DC | PRN
  Administered 2019-02-25 (×2): 50 ug via INTRAVENOUS

## 2019-02-25 MED ORDER — RIVAROXABAN 10 MG PO TABS
20.0000 mg | ORAL_TABLET | Freq: Every day | ORAL | Status: DC
  Administered 2019-02-26: 20 mg via ORAL
  Filled 2019-02-25: qty 2

## 2019-02-25 MED ORDER — ONDANSETRON HCL 4 MG/2ML IJ SOLN
INTRAMUSCULAR | Status: AC
  Filled 2019-02-25: qty 2

## 2019-02-25 MED ORDER — ONDANSETRON HCL 4 MG/2ML IJ SOLN
INTRAMUSCULAR | Status: DC | PRN
  Administered 2019-02-25: 4 mg via INTRAVENOUS

## 2019-02-25 MED ORDER — KETOROLAC TROMETHAMINE 30 MG/ML IJ SOLN
INTRAMUSCULAR | Status: AC
  Filled 2019-02-25: qty 1

## 2019-02-25 MED ORDER — DEXAMETHASONE SODIUM PHOSPHATE 10 MG/ML IJ SOLN
10.0000 mg | Freq: Once | INTRAMUSCULAR | Status: AC
  Administered 2019-02-26: 10 mg via INTRAVENOUS
  Filled 2019-02-25: qty 1

## 2019-02-25 MED ORDER — SODIUM CHLORIDE (PF) 0.9 % IJ SOLN
INTRAMUSCULAR | Status: AC
  Filled 2019-02-25: qty 10

## 2019-02-25 MED ORDER — CHLORHEXIDINE GLUCONATE 4 % EX LIQD: 60.0000 mL | Freq: Once | CUTANEOUS | Status: DC

## 2019-02-25 MED ORDER — DIGOXIN 125 MCG PO TABS
0.1250 mg | ORAL_TABLET | Freq: Every day | ORAL | Status: DC
  Administered 2019-02-26 – 2019-02-27 (×2): 0.125 mg via ORAL
  Filled 2019-02-25 (×2): qty 1

## 2019-02-25 MED ORDER — POTASSIUM GLUCONATE 550 (90 K) MG PO TABS: 550.0000 mg | ORAL_TABLET | Freq: Every day | ORAL | Status: DC

## 2019-02-25 MED ORDER — MAGNESIUM OXIDE 400 (241.3 MG) MG PO TABS
200.0000 mg | ORAL_TABLET | Freq: Every day | ORAL | Status: DC
  Administered 2019-02-26 – 2019-02-27 (×2): 200 mg via ORAL
  Filled 2019-02-25 (×2): qty 1

## 2019-02-25 MED ORDER — POLYETHYLENE GLYCOL 3350 17 G PO PACK
17.0000 g | PACK | Freq: Every day | ORAL | Status: DC | PRN
  Administered 2019-02-27: 17 g via ORAL

## 2019-02-25 MED ORDER — DILTIAZEM HCL ER COATED BEADS 120 MG PO CP24
120.0000 mg | ORAL_CAPSULE | Freq: Every day | ORAL | Status: DC
  Administered 2019-02-26 – 2019-02-27 (×2): 120 mg via ORAL
  Filled 2019-02-25 (×2): qty 1

## 2019-02-25 MED ORDER — LACTATED RINGERS IV SOLN
INTRAVENOUS | Status: DC
  Administered 2019-02-25 (×2): via INTRAVENOUS

## 2019-02-25 MED ORDER — OXYCODONE HCL 5 MG PO TABS: 5.0000 mg | ORAL_TABLET | ORAL | 0 refills | Status: DC | PRN

## 2019-02-25 MED ORDER — ONDANSETRON HCL 4 MG/2ML IJ SOLN
4.0000 mg | Freq: Four times a day (QID) | INTRAMUSCULAR | Status: DC | PRN
  Administered 2019-02-26: 4 mg via INTRAVENOUS
  Filled 2019-02-25: qty 2

## 2019-02-25 MED ORDER — MAGNESIUM 250 MG PO TABS: 250.0000 mg | ORAL_TABLET | Freq: Every day | ORAL | Status: DC

## 2019-02-25 MED ORDER — HYDROMORPHONE HCL 1 MG/ML IJ SOLN: 0.2500 mg | INTRAMUSCULAR | Status: DC | PRN

## 2019-02-25 MED ORDER — FENTANYL CITRATE (PF) 250 MCG/5ML IJ SOLN
INTRAMUSCULAR | Status: AC
  Filled 2019-02-25: qty 5

## 2019-02-25 MED ORDER — ACETAMINOPHEN 325 MG PO TABS: 325.0000 mg | ORAL_TABLET | Freq: Four times a day (QID) | ORAL | Status: DC | PRN

## 2019-02-25 MED ORDER — VITAMIN D 25 MCG (1000 UNIT) PO TABS
2000.0000 [IU] | ORAL_TABLET | Freq: Every day | ORAL | Status: DC
  Administered 2019-02-26 – 2019-02-27 (×2): 2000 [IU] via ORAL
  Filled 2019-02-25 (×2): qty 2

## 2019-02-25 MED ORDER — DIPHENHYDRAMINE HCL 12.5 MG/5ML PO ELIX: 12.5000 mg | ORAL_SOLUTION | ORAL | Status: DC | PRN

## 2019-02-25 MED ORDER — FENTANYL CITRATE (PF) 100 MCG/2ML IJ SOLN
50.0000 ug | Freq: Once | INTRAMUSCULAR | Status: AC
  Administered 2019-02-25: 50 ug via INTRAVENOUS
  Filled 2019-02-25: qty 2

## 2019-02-25 MED ORDER — METHOCARBAMOL 500 MG PO TABS
500.0000 mg | ORAL_TABLET | Freq: Four times a day (QID) | ORAL | Status: DC | PRN
  Administered 2019-02-26 – 2019-02-27 (×2): 500 mg via ORAL
  Filled 2019-02-25 (×2): qty 1

## 2019-02-25 MED ORDER — ONDANSETRON HCL 4 MG PO TABS: 4.0000 mg | ORAL_TABLET | Freq: Three times a day (TID) | ORAL | 0 refills | Status: DC | PRN

## 2019-02-25 MED ORDER — HYDROMORPHONE HCL 1 MG/ML IJ SOLN
INTRAMUSCULAR | Status: AC
  Filled 2019-02-25: qty 2

## 2019-02-25 MED ORDER — ONDANSETRON HCL 4 MG PO TABS: 4.0000 mg | ORAL_TABLET | Freq: Four times a day (QID) | ORAL | Status: DC | PRN

## 2019-02-25 MED ORDER — OXYCODONE HCL 5 MG PO TABS: 10.0000 mg | ORAL_TABLET | ORAL | Status: DC | PRN

## 2019-02-25 MED ORDER — SENNA-DOCUSATE SODIUM 8.6-50 MG PO TABS: 2.0000 | ORAL_TABLET | Freq: Every day | ORAL | 1 refills | Status: AC

## 2019-02-25 MED ORDER — METHOCARBAMOL 500 MG IVPB - SIMPLE MED
500.0000 mg | Freq: Four times a day (QID) | INTRAVENOUS | Status: DC | PRN
  Administered 2019-02-25: 500 mg via INTRAVENOUS
  Filled 2019-02-25: qty 50

## 2019-02-25 MED ORDER — CEFAZOLIN SODIUM-DEXTROSE 2-4 GM/100ML-% IV SOLN
2.0000 g | Freq: Four times a day (QID) | INTRAVENOUS | Status: AC
  Administered 2019-02-25 – 2019-02-26 (×2): 2 g via INTRAVENOUS
  Filled 2019-02-25 (×2): qty 100

## 2019-02-25 MED ORDER — ALUM & MAG HYDROXIDE-SIMETH 200-200-20 MG/5ML PO SUSP: 30.0000 mL | ORAL | Status: DC | PRN

## 2019-02-25 MED ORDER — BUPIVACAINE HCL (PF) 0.25 % IJ SOLN
INTRAMUSCULAR | Status: AC
  Filled 2019-02-25: qty 30

## 2019-02-25 MED ORDER — PROMETHAZINE HCL 25 MG/ML IJ SOLN: 6.2500 mg | INTRAMUSCULAR | Status: DC | PRN

## 2019-02-25 MED ORDER — DEXAMETHASONE SODIUM PHOSPHATE 10 MG/ML IJ SOLN
INTRAMUSCULAR | Status: AC
  Filled 2019-02-25: qty 1

## 2019-02-25 MED ORDER — LIDOCAINE 2% (20 MG/ML) 5 ML SYRINGE
INTRAMUSCULAR | Status: AC
  Filled 2019-02-25: qty 5

## 2019-02-25 MED ORDER — PROPOFOL 10 MG/ML IV BOLUS
INTRAVENOUS | Status: AC
  Filled 2019-02-25: qty 40

## 2019-02-25 MED ORDER — DEXAMETHASONE SODIUM PHOSPHATE 10 MG/ML IJ SOLN
INTRAMUSCULAR | Status: DC | PRN
  Administered 2019-02-25: 5 mg via INTRAVENOUS

## 2019-02-25 MED ORDER — BACLOFEN 10 MG PO TABS: 10.0000 mg | ORAL_TABLET | Freq: Three times a day (TID) | ORAL | 0 refills | Status: DC

## 2019-02-25 MED ORDER — HYDROMORPHONE HCL 1 MG/ML IJ SOLN: 0.5000 mg | INTRAMUSCULAR | Status: DC | PRN

## 2019-02-25 MED ORDER — METOCLOPRAMIDE HCL 5 MG PO TABS: 5.0000 mg | ORAL_TABLET | Freq: Three times a day (TID) | ORAL | Status: DC | PRN

## 2019-02-25 MED ORDER — 0.9 % SODIUM CHLORIDE (POUR BTL) OPTIME
TOPICAL | Status: DC | PRN
  Administered 2019-02-25: 1000 mL

## 2019-02-25 MED ORDER — PHENOL 1.4 % MT LIQD
1.0000 | OROMUCOSAL | Status: DC | PRN
  Filled 2019-02-25: qty 177

## 2019-02-25 MED ORDER — TRANEXAMIC ACID-NACL 1000-0.7 MG/100ML-% IV SOLN
1000.0000 mg | Freq: Once | INTRAVENOUS | Status: AC
  Administered 2019-02-25: 1000 mg via INTRAVENOUS
  Filled 2019-02-25: qty 100

## 2019-02-25 MED ORDER — MENTHOL 3 MG MT LOZG: 1.0000 | LOZENGE | OROMUCOSAL | Status: DC | PRN

## 2019-02-25 MED ORDER — POTASSIUM GLUCONATE 595 (99 K) MG PO TABS
595.0000 mg | ORAL_TABLET | Freq: Every day | ORAL | Status: DC
  Administered 2019-02-26 – 2019-02-27 (×2): 595 mg via ORAL
  Filled 2019-02-25 (×2): qty 1

## 2019-02-25 MED ORDER — BUPIVACAINE HCL 0.25 % IJ SOLN
INTRAMUSCULAR | Status: DC | PRN
  Administered 2019-02-25: 30 mL

## 2019-02-25 MED ORDER — HYDROMORPHONE HCL 1 MG/ML IJ SOLN
INTRAMUSCULAR | Status: DC | PRN
  Administered 2019-02-25 (×5): .4 mg via INTRAVENOUS

## 2019-02-25 MED ORDER — ACETAMINOPHEN 500 MG PO TABS
1000.0000 mg | ORAL_TABLET | Freq: Once | ORAL | Status: AC
  Administered 2019-02-25: 1000 mg via ORAL
  Filled 2019-02-25: qty 2

## 2019-02-25 MED ORDER — DOCUSATE SODIUM 100 MG PO CAPS
100.0000 mg | ORAL_CAPSULE | Freq: Two times a day (BID) | ORAL | Status: DC
  Administered 2019-02-25 – 2019-02-27 (×4): 100 mg via ORAL
  Filled 2019-02-25 (×4): qty 1

## 2019-02-25 SURGICAL SUPPLY — 58 items
BAG ZIPLOCK 12X15 (MISCELLANEOUS) ×1 IMPLANT
BLADE SAG 18X100X1.27 (BLADE) ×4 IMPLANT
BLADE SURG 15 STRL LF DISP TIS (BLADE) ×1 IMPLANT
BLADE SURG 15 STRL SS (BLADE) ×1
BLADE SURG SZ10 CARB STEEL (BLADE) ×4 IMPLANT
BNDG ELASTIC 6X10 VLCR STRL LF (GAUZE/BANDAGES/DRESSINGS) ×1 IMPLANT
BNDG ELASTIC 6X15 VLCR STRL LF (GAUZE/BANDAGES/DRESSINGS) ×1 IMPLANT
BONE VNGD MED NAIL (MISCELLANEOUS) ×1 IMPLANT
BOWL SMART MIX CTS (DISPOSABLE) ×2 IMPLANT
CEMENT BONE R 1X40 (Cement) ×4 IMPLANT
CLSR STERI-STRIP ANTIMIC 1/2X4 (GAUZE/BANDAGES/DRESSINGS) ×3 IMPLANT
COMP FEM VG IL 70 RT (Knees) ×2 IMPLANT
COMPONENT FEM VG IL 70 RT (Knees) IMPLANT
COVER SURGICAL LIGHT HANDLE (MISCELLANEOUS) ×2 IMPLANT
COVER WAND RF STERILE (DRAPES) IMPLANT
CUFF TOURN SGL QUICK 34 (TOURNIQUET CUFF) ×1
CUFF TRNQT CYL 34X4.125X (TOURNIQUET CUFF) ×1 IMPLANT
DECANTER SPIKE VIAL GLASS SM (MISCELLANEOUS) ×1 IMPLANT
DISTAL FEMORAL PEG (Knees) ×2 IMPLANT
DRAPE U-SHAPE 47X51 STRL (DRAPES) ×2 IMPLANT
DRESSING AQUACEL AG SP 3.5X4 (GAUZE/BANDAGES/DRESSINGS) IMPLANT
DRSG AQUACEL AG SP 3.5X4 (GAUZE/BANDAGES/DRESSINGS) ×2
DRSG MEPILEX BORDER 4X4 (GAUZE/BANDAGES/DRESSINGS) ×1 IMPLANT
DRSG MEPILEX BORDER 4X8 (GAUZE/BANDAGES/DRESSINGS) ×2 IMPLANT
DRSG PAD ABDOMINAL 8X10 ST (GAUZE/BANDAGES/DRESSINGS) ×3 IMPLANT
DURAPREP 26ML APPLICATOR (WOUND CARE) ×4 IMPLANT
ELECT REM PT RETURN 15FT ADLT (MISCELLANEOUS) ×2 IMPLANT
GLOVE BIO SURGEON STRL SZ7.5 (GLOVE) ×2 IMPLANT
GLOVE BIO SURGEON STRL SZ8 (GLOVE) ×2 IMPLANT
GLOVE BIOGEL PI IND STRL 8 (GLOVE) ×2 IMPLANT
GLOVE BIOGEL PI INDICATOR 8 (GLOVE) ×2
GOWN STRL REUS W/TWL 2XL LVL3 (GOWN DISPOSABLE) ×2 IMPLANT
GOWN STRL REUS W/TWL LRG LVL3 (GOWN DISPOSABLE) ×2 IMPLANT
HANDPIECE INTERPULSE COAX TIP (DISPOSABLE) ×1
HOLDER FOLEY CATH W/STRAP (MISCELLANEOUS) ×1 IMPLANT
HOOD PEEL AWAY FLYTE STAYCOOL (MISCELLANEOUS) ×6 IMPLANT
IMMOBILIZER KNEE 20 (SOFTGOODS) ×2
IMMOBILIZER KNEE 20 THIGH 36 (SOFTGOODS) ×1 IMPLANT
INSERT TIBIA BEAR 7/75 10 KNEE (Knees) ×1 IMPLANT
KIT TURNOVER KIT A (KITS) ×1 IMPLANT
MANIFOLD NEPTUNE II (INSTRUMENTS) ×2 IMPLANT
NS IRRIG 1000ML POUR BTL (IV SOLUTION) ×1 IMPLANT
PACK ICE MAXI GEL EZY WRAP (MISCELLANEOUS) ×1 IMPLANT
PACK TOTAL KNEE CUSTOM (KITS) ×2 IMPLANT
PEG FEMORAL DISTAL (Knees) IMPLANT
PEG PATELLA SERIES A 37MMX10MM (Orthopedic Implant) ×1 IMPLANT
PLATE KNEE TIBIAL 75MM FIXED (Plate) ×1 IMPLANT
PROTECTOR NERVE ULNAR (MISCELLANEOUS) ×2 IMPLANT
SET HNDPC FAN SPRY TIP SCT (DISPOSABLE) ×1 IMPLANT
STRIP CLOSURE SKIN 1/2X4 (GAUZE/BANDAGES/DRESSINGS) ×2 IMPLANT
SUT VIC AB 0 CT1 36 (SUTURE) ×2 IMPLANT
SUT VIC AB 2-0 CT1 27 (SUTURE) ×1
SUT VIC AB 2-0 CT1 TAPERPNT 27 (SUTURE) ×1 IMPLANT
SUT VIC AB 3-0 SH 8-18 (SUTURE) ×2 IMPLANT
TRAY FOLEY MTR SLVR 16FR STAT (SET/KITS/TRAYS/PACK) ×2 IMPLANT
WATER STERILE IRR 1000ML POUR (IV SOLUTION) ×4 IMPLANT
WRAP KNEE MAXI GEL POST OP (GAUZE/BANDAGES/DRESSINGS) ×1 IMPLANT
YANKAUER SUCT BULB TIP NO VENT (SUCTIONS) ×1 IMPLANT

## 2019-02-25 NOTE — Anesthesia Procedure Notes (Signed)
Procedure Name: LMA Insertion Date/Time: 02/25/2019 3:31 PM Performed by: Lollie Sails, CRNA Pre-anesthesia Checklist: Patient identified, Emergency Drugs available, Suction available, Patient being monitored and Timeout performed Patient Re-evaluated:Patient Re-evaluated prior to induction Oxygen Delivery Method: Circle system utilized Preoxygenation: Pre-oxygenation with 100% oxygen Induction Type: IV induction LMA: LMA inserted LMA Size: 5.0 Number of attempts: 1 Placement Confirmation: positive ETCO2 and breath sounds checked- equal and bilateral Tube secured with: Tape Dental Injury: Teeth and Oropharynx as per pre-operative assessment

## 2019-02-25 NOTE — Anesthesia Procedure Notes (Signed)
Anesthesia Procedure Image    

## 2019-02-25 NOTE — H&P (Signed)
PREOPERATIVE H&P  Chief Complaint: Right knee pain  HPI: Ronnie Booth is a 73 y.o. male who presents for preoperative history and physical with a diagnosis of right knee osteoarthritis. Symptoms are rated as moderate to severe, and have been worsening.  This is significantly impairing activities of daily living.  He has elected for surgical management.  His had a previous ACL reconstruction and also has retained hardware.  He has failed injections, activity modification, anti-inflammatories, and assistive devices.  Preoperative X-rays demonstrate end stage degenerative changes with osteophyte formation, loss of joint space, subchondral sclerosis. He has had a contralateral total knee done in [place], and that was in 2014, and he has had relatively good outcome with that although he does report recent pain associated with it.  Past Medical History:  Diagnosis Date  . AR (aortic regurgitation) 02/11/2018   Mild, Noted on ECHO  . Arthritis   . Atrial fibrillation by electrocardiogram ([facility]) 10/14/2018  . Cardiomyopathy ([place])   . Dyslipidemia   . Dyspnea 02[place]2017   [place]  . Heart murmur    History of murmur in high school, undetected at this time  . History of kidney stones   . Hypertension   . LAE (left atrial enlargement) 02/11/2018   Severly dilated, Noted on ECHO  . LVH (left ventricular hypertrophy) 02/11/2018   Mild, Noted on ECHO  . Medical history reviewed with no changes   . MR (mitral regurgitation) 02/11/2018   Mild, Noted on ECHO  . Prostate cancer [facility])    Past Surgical History:  Procedure Laterality Date  . APPENDECTOMY     had internal bleeding and had to go back to surgery  . CARDIOVERSION N/A 03/12/2017   Procedure: CARDIOVERSION;  Surgeon: Sanda Klein, MD;  Location: [facility];  Service: Cardiovascular;  Laterality: N/A;  . COLONOSCOPY    . INGUINAL HERNIA REPAIR Left   . JOINT REPLACEMENT Left    knee  . LITHOTRIPSY    . PROSTATE SURGERY   2002  . REVERSE SHOULDER ARTHROPLASTY Right 01/13/2016   Procedure: RIGHT REVERSE SHOULDER ARTHROPLASTY;  Surgeon: Justice Britain, MD;  Location: [facility];  Service: Orthopedics;  Laterality: Right;   Social History   Socioeconomic History  . Marital status: Married    Spouse name: Not on file  . Number of children: 2  . Years of education: Not on file  . Highest education level: Not on file  Occupational History  . Occupation: Auto Work  Scientific laboratory technician  . Financial resource strain: Not on file  . Food insecurity    Worry: Not on file    Inability: Not on file  . Transportation needs    Medical: Not on file    Non-medical: Not on file  Tobacco Use  . Smoking status: Never Smoker  . Smokeless tobacco: Never Used  Substance and Sexual Activity  . Alcohol use: Yes    Comment: occasional  . Drug use: No  . Sexual activity: Not on file  Lifestyle  . Physical activity    Days per week: Not on file    Minutes per session: Not on file  . Stress: Not on file  Relationships  . Social Herbalist on phone: Not on file    Gets together: Not on file    Attends religious service: Not on file    Active member of club or organization: Not on file    Attends meetings of clubs or organizations: Not  on file    Relationship status: Not on file  Other Topics Concern  . Not on file  Social History Narrative  . Not on file   Family History  Problem Relation Age of Onset  . Alzheimer's disease Mother   . Parkinson's disease Mother   . Lung cancer Father    No Known Allergies Prior to Admission medications   Medication Sig Start Date End Date Taking? Authorizing Provider  B Complex-C (SUPER B COMPLEX PO) Take 1 tablet by mouth daily.   Yes [provider]  bisoprolol-hydrochlorothiazide (ZIAC) 10-6.25 MG tablet Take 1 tablet by mouth every morning.   Yes [provider]  CARTIA XT 120 MG 24 hr capsule Take 120 mg by mouth daily.  07/11/17  Yes [provider]  Cholecalciferol (VITAMIN D3) 2000 units TABS Take 2,000 Units by mouth daily.    Yes [provider]  diclofenac sodium (VOLTAREN) 1 % GEL Apply topically 4 (four) times daily.   Yes [provider]  digoxin (LANOXIN) 0.125 MG tablet TAKE ONE TABLET BY MOUTH DAILY Patient taking differently: Take 0.125 mg by mouth daily.  07/01/18  Yes Minus Breeding, MD  ENTRESTO 97-103 MG TAKE ONE TABLET BY MOUTH TWICE A DAY 11/01/18  Yes Minus Breeding, MD  Magnesium 250 MG TABS Take 250 mg by mouth daily.    Yes [provider]  Omega-3 Fatty Acids (FISH OIL) 1200 MG CAPS Take 1,200 mg by mouth daily.    Yes [provider]  Potassium Gluconate 550 (90 K) MG TABS Take 550 mg by mouth daily.    Yes [provider]  rivaroxaban (XARELTO) 20 MG TABS tablet Take 1 tablet (20 mg total) by mouth daily with supper. 06/20/17  Yes Minus Breeding, MD     Positive ROS: All other systems have been reviewed and were otherwise negative with the exception of those mentioned in the HPI and as above.  Physical Exam: General: Alert, no acute distress Cardiovascular: No pedal edema Respiratory: No cyanosis, no use of accessory musculature GI: No organomegaly, abdomen is soft and non-tender Skin: No lesions in the area of chief complaint, well-healed previous ACL reconstruction scars. Neurologic: Sensation intact distally Psychiatric: Patient is competent for consent with normal mood and affect Lymphatic: No axillary or cervical lymphadenopathy  MUSCULOSKELETAL: Right knee has crepitance with varus alignment, range of motion from 0 to 120 degrees, positive effusion.  Assessment: Right knee osteoarthritis, history of ACL reconstruction with retained hardware   Plan: Plan for Procedure(s): TOTAL KNEE ARTHROPLASTY  The risks benefits and alternatives were discussed with the patient including but not limited to the risks of nonoperative treatment, versus  surgical intervention including infection, bleeding, nerve injury,  blood clots, cardiopulmonary complications, morbidity, mortality, among others, and they were willing to proceed.   Anticipated LOS equal to or greater than 2 midnights due to - Age 63 and older with one or more of the following:  - Obesity  - Expected need for hospital services (PT, OT, Nursing) required for safe  discharge  - Anticipated need for postoperative skilled nursing care or inpatient rehab  - Active co-morbidities: Cardiac Arrhythmia OR   - Unanticipated findings during/Post Surgery: None  - Patient is a high risk of re-admission due to: None     Johnny Bridge, MD Cell [phone]   02/25/2019 12:53 PM

## 2019-02-25 NOTE — Progress Notes (Signed)
Patient and wife informed that surgery is delayed due to equipment issue. Will keep wife updated via text.

## 2019-02-25 NOTE — Transfer of Care (Signed)
Immediate Anesthesia Transfer of Care Note  Patient: Ronnie Booth  Procedure(s) Performed: TOTAL KNEE ARTHROPLASTY AND HARDWARE REMOVAL (Right Knee)  Patient Location: PACU  Anesthesia Type:GA combined with regional for post-op pain  Level of Consciousness: awake, alert  and patient cooperative  Airway & Oxygen Therapy: Patient Spontanous Breathing and Patient connected to face mask oxygen  Post-op Assessment: Report given to RN and Post -op Vital signs reviewed and stable  Post vital signs: Reviewed and stable  Last Vitals:  Vitals Value Taken Time  BP 158/98 02/25/19 1849  Temp    Pulse 122 02/25/19 1852  Resp 9 02/25/19 1852  SpO2 98 % 02/25/19 1852  Vitals shown include unvalidated device data.  Last Pain:  Vitals:   02/25/19 1052  TempSrc: Oral         Complications: No apparent anesthesia complications

## 2019-02-25 NOTE — Discharge Instructions (Signed)

## 2019-02-25 NOTE — Op Note (Signed)
DATE OF SURGERY:  02/25/2019 TIME: 6:06 PM  PATIENT NAME:  Ronnie Booth   AGE: 73 y.o.    PRE-OPERATIVE DIAGNOSIS: Posttraumatic osteoarthritis, right knee with retained hardware from remote ACL reconstruction  POST-OPERATIVE DIAGNOSIS:  Same  PROCEDURE: RIGHT total Knee Arthroplasty with removal of hardware, deep, a total of 3 screws with washers  SURGEON:  Johnny Bridge, MD   ASSISTANT:  Joya Gaskins, OPA-C, present and scrubbed throughout the case, critical for assistance with exposure, retraction, instrumentation, and closure.   OPERATIVE IMPLANTS: Biomet Vanguard Fixed Bearing Posterior Stabilized Femur size 70, Tibia size 75, Patella size 37 3-peg oval button, with a 10 mm polyethylene insert.   PREOPERATIVE INDICATIONS:  OMRI BERTRAN is a 73 y.o. year old male with end stage bone on bone degenerative arthritis of the knee who failed conservative treatment, including injections, antiinflammatories, activity modification, and assistive devices, and had significant impairment of their activities of daily living, and elected for Total Knee Arthroplasty.  He had a previous ACL reconstruction with a bone patella bone graft with retained hardware that was predicted to be in the way of the arthroplasty.  The risks, benefits, and alternatives were discussed at length including but not limited to the risks of infection, bleeding, nerve injury, stiffness, blood clots, the need for revision surgery, cardiopulmonary complications, among others, and they were willing to proceed.  OPERATIVE FINDINGS AND UNIQUE ASPECTS OF THE CASE: He had fairly severe arthritic changes throughout the knee, I think his graft was actually still intact although somewhat dysfunctional.  The femoral screw was exactly in the way of the intramedullary guide, and the distal tibial screw was exactly in the way of the tibial punch, and the proximal screw was as well.  The femoral screw was slightly  stripped, and was very challenging to get the correct angle and access, I had to make a lateral approach and could not access it from the anterior aspect.  There was bony overgrowth as well.  ESTIMATED BLOOD LOSS: 150 mL  OPERATIVE DESCRIPTION:  The patient was brought to the operative room and placed in a supine position.  General anesthesia was administered.  IV antibiotics were given.  The lower extremity was prepped and draped in the usual sterile fashion.  Time out was performed.  The leg was elevated and exsanguinated and the tourniquet was inflated.  Anterior quadriceps tendon splitting approach was performed.  I used his previous anterior midline scar.  The patella was everted and osteophytes were removed.  The anterior horn of the medial and lateral meniscus was removed.  I exposed the proximal screw and remove this with a T-handle screwdriver.  I was hoping this was the only screw that I would need to remove.  The distal femur was opened with the drill, and I directly encountered the femoral screw.  I dissected laterally, removing fat pad, and found the screw head, but to get the correct line I had to make a lateral incision and go through the IT band.  This was challenging, and took about 45 minutes to finally get the screw out, it was partially stripped, had overgrown with bone, and was difficult to get the access.  Ultimately I was able to remove the screw as well as the washer.   The intramedullary distal femoral cutting jig was utilized, set at 5 degrees resecting 9 mm off the distal femur.  Care was taken to protect the collateral ligaments.  Then the extramedullary tibial cutting jig was  utilized making the appropriate cut using the anterior tibial crest as a reference building in appropriate posterior slope.  Care was taken during the cut to protect the medial and collateral ligaments.  The proximal tibia was removed along with the posterior horns of the menisci.  The PCL was  sacrificed.    The extensor gap was measured and was approximately 50mm.    The distal femoral sizing jig was applied, taking care to avoid notching.  Then the 4-in-1 cutting jig was applied and the anterior and posterior femur was cut, along with the chamfer cuts.  All posterior osteophytes were removed.  The flexion gap was then measured and was symmetric with the extension gap.  I completed the distal femoral preparation using the appropriate jig to prepare the box.  The patella was then measured, and cut with the saw.  The thickness before the cut was 22 and after the cut was 13.  I had 1 pass of the sawblade slightly deeper, that left a small gap that would be filled in later with cement.  The proximal tibia sized and prepared accordingly with the reamer and the punch, although unfortunately during placement of the punch I engaged the second screw, which I then had to go back and removed, and then I replaced the punch and completed the tibial preparation, and then all components were trialed with the 77mm poly insert.  The knee was found to have excellent balance and full motion.    The above named components were then cemented into place and all excess cement was removed.  The real polyethylene implant was placed.  After the cement had cured I released the tourniquet and confirmed excellent hemostasis with no major posterior vessel injury.  I had to drop the tourniquet after all of the implants were in, because I was at 121 minutes.  The cement had not totally cured.  The knee was easily taken through a range of motion and the patella tracked well and the knee irrigated copiously and the parapatellar and subcutaneous tissue closed with vicryl, and monocryl with steri strips for the skin.  The wounds were injected with marcaine, and dressed with sterile gauze and the patient was awakened and returned to the PACU in stable and satisfactory condition.  There were no complications.  Total  tourniquet time was 121 minutes.

## 2019-02-25 NOTE — Progress Notes (Signed)
Assisted Dr. Rose with right, ultrasound guided, adductor canal block. Side rails up, monitors on throughout procedure. See vital signs in flow sheet. Tolerated Procedure well.  

## 2019-02-25 NOTE — Anesthesia Procedure Notes (Signed)
Anesthesia Regional Block: Adductor canal block   Pre-Anesthetic Checklist: ,, timeout performed, Correct Patient, Correct Site, Correct Laterality, Correct Procedure, Correct Position, site marked, Risks and benefits discussed,  Surgical consent,  Pre-op evaluation,  At surgeon's request and post-op pain management  Laterality: Right  Prep: chloraprep       Needles:  Injection technique: Single-shot  Needle Type: Echogenic Needle     Needle Length: 9cm      Additional Needles:   Procedures:,,,, ultrasound used (permanent image in chart),,,,  Narrative:  Start time: 02/25/2019 2:19 PM End time: 02/25/2019 2:26 PM Injection made incrementally with aspirations every 5 mL.  Performed by: Personally  Anesthesiologist: Myrtie Soman, MD  Additional Notes: Patient tolerated the procedure well without complications

## 2019-02-26 ENCOUNTER — Encounter (HOSPITAL_COMMUNITY): Payer: Self-pay | Admitting: Orthopedic Surgery

## 2019-02-26 LAB — BASIC METABOLIC PANEL
Anion gap: 10 (ref 5–15)
BUN: 21 mg/dL (ref 8–23)
CO2: 22 mmol/L (ref 22–32)
Calcium: 8.4 mg/dL — ABNORMAL LOW (ref 8.9–10.3)
Chloride: 103 mmol/L (ref 98–111)
Creatinine, Ser: 0.99 mg/dL (ref 0.61–1.24)
GFR calc Af Amer: >60 mL/min (ref >60)
GFR calc non Af Amer: >60 mL/min (ref >60)
Glucose, Bld: 177 mg/dL — ABNORMAL HIGH (ref 70–99)
Potassium: 4.6 mmol/L (ref 3.5–5.1)
Sodium: 135 mmol/L (ref 135–145)

## 2019-02-26 LAB — CBC
HCT: 42.3 % (ref 39.0–52.0)
Hemoglobin: 13.5 g/dL (ref 13.0–17.0)
MCH: 30.5 pg (ref 26.0–34.0)
MCHC: 31.9 g/dL (ref 30.0–36.0)
MCV: 95.5 fL (ref 80.0–100.0)
Platelets: 196 10*3/uL (ref 150–400)
RBC: 4.43 MIL/uL (ref 4.22–5.81)
RDW: 13.2 % (ref 11.5–15.5)
WBC: 8.8 10*3/uL (ref 4.0–10.5)
nRBC: 0 % (ref 0.0–0.2)

## 2019-02-26 NOTE — Progress Notes (Signed)
Physical Therapy Treatment Patient Details Name: Ronnie Booth MRN: [id number] DOB: 1946/05/06 Today's Date: 02/26/2019    History of Present Illness s/p R TKA.Marland Kitchen PMH: reverse R TKA, L TKA    PT Comments    POD # 1 pm session Pt with increased c/o fatigue.  Reports poor sleep last 2 nights and late surgery.  Assisted with amb in hallway, practiced one step General Gait Details: decreased amb distance due to increased c/o fatigue.  Mild unsteadyThen returned to room to perform some TE's following HEP handout.  Instructed on proper tech, freq as well as use of ICE.   Pt has not met goals to safely D/C to home today.  Reported to RN.    Follow Up Recommendations  Home health PT     Equipment Recommendations  Rolling walker with 5" wheels    Recommendations for Other Services       Precautions / Restrictions Precautions Precautions: Fall;Knee Precaution Comments: pt able SLR Restrictions Weight Bearing Restrictions: No Other Position/Activity Restrictions: WBAT    Mobility  Bed Mobility Overal bed mobility: Needs Assistance Bed Mobility: Sit to Supine     Supine to sit: Supervision     General bed mobility comments: pt self able crossing LE to self perform  Transfers Overall transfer level: Needs assistance Equipment used: Rolling walker (2 wheeled) Transfers: Sit to/from Stand Sit to Stand: Supervision;Min guard         General transfer comment: < 25% VC's on safety with turns  Ambulation/Gait Ambulation/Gait assistance: Min guard;Min assist Gait Distance (Feet): 45 Feet Assistive device: Rolling walker (2 wheeled) Gait Pattern/deviations: Step-to pattern;Decreased weight shift to right Gait velocity: decreased   General Gait Details: decreased amb distance due to increased c/o fatigue.  Mild unsteady   Stairs             Wheelchair Mobility    Modified Rankin (Stroke Patients Only)       Balance                                            Cognition Arousal/Alertness: Awake/alert Behavior During Therapy: WFL for tasks assessed/performed Overall Cognitive Status: Within Functional Limits for tasks assessed                                 General Comments: very sharpe      Exercises Total Joint Exercises Ankle Circles/Pumps: AROM;10 reps;Both Quad Sets: 5 reps;AROM;Both    General Comments        Pertinent Vitals/Pain Pain Assessment: 0-10 Pain Score: 4  Pain Location: right knee Pain Descriptors / Indicators: Tender Pain Intervention(s): Monitored during session;Repositioned;Ice applied    Home Living                      Prior Function            PT Goals (current goals can now be found in the care plan section) Acute Rehab PT Goals PT Goal Formulation: With patient Time For Goal Achievement: 03/05/19 Potential to Achieve Goals: Good Progress towards PT goals: Progressing toward goals    Frequency    7X/week      PT Plan Current plan remains appropriate    Co-evaluation  AM-PAC PT "6 Clicks" Mobility   Outcome Measure  Help needed turning from your back to your side while in a flat bed without using bedrails?: A Little Help needed moving from lying on your back to sitting on the side of a flat bed without using bedrails?: A Little Help needed moving to and from a bed to a chair (including a wheelchair)?: A Little Help needed standing up from a chair using your arms (e.g., wheelchair or bedside chair)?: A Little Help needed to walk in hospital room?: A Little Help needed climbing 3-5 steps with a railing? : A Little 6 Click Score: 18    End of Session Equipment Utilized During Treatment: Gait belt Activity Tolerance: Patient limited by fatigue Patient left: in bed;with call bell/phone within reach;with bed alarm set Nurse Communication: Mobility status(pt has not met PT goals to safely D/C to home today) PT Visit Diagnosis:  Difficulty in walking, not elsewhere classified (R26.2)     Time: 9847-3085 PT Time Calculation (min) (ACUTE ONLY): 40 min  Charges:  $Gait Training: 8-22 mins $Therapeutic Exercise: 8-22 mins $Therapeutic Activity: 8-22 mins                     Rica Koyanagi  PTA Acute  Rehabilitation Services Pager      [phone] Office      [phone]

## 2019-02-26 NOTE — Progress Notes (Addendum)
Patient ID: Ronnie Booth, male   DOB: 01-17-1946, 73 y.o.   MRN: [id number]     Subjective:  Patient reports pain as mild.  Patient in bed and interested in going Home today  Objective:   VITALS:   Vitals:   02/25/19 2158 02/25/19 2251 02/26/19 0051 02/26/19 0459  BP: 132/89 (!) 129/93 (!) 136/92 133/90  Pulse: 70 67 78 79  Resp: 16 20 20 20   Temp: 97.8 F (36.6 C) 97.7 F (36.5 C) (!) 97.3 F (36.3 C) (!) 97.5 F (36.4 C)  TempSrc: Oral Oral Oral Oral  SpO2: 96% 99% 99% 97%  Weight:      Height:        ABD soft Sensation intact distally Dorsiflexion/Plantar flexion intact Incision: dressing C/D/I and no drainage   Lab Results  Component Value Date   WBC 8.8 02/26/2019   HGB 13.5 02/26/2019   HCT 42.3 02/26/2019   MCV 95.5 02/26/2019   PLT 196 02/26/2019   BMET    Component Value Date/Time   NA 135 02/26/2019 0335   NA 141 11/13/2017 0807   K 4.6 02/26/2019 0335   CL 103 02/26/2019 0335   CO2 22 02/26/2019 0335   GLUCOSE 177 (H) 02/26/2019 0335   BUN 21 02/26/2019 0335   BUN 28 (H) 11/13/2017 0807   CREATININE 0.99 02/26/2019 0335   CALCIUM 8.4 (L) 02/26/2019 0335   GFRNONAA >60 02/26/2019 0335   GFRAA >60 02/26/2019 0335     Assessment/Plan: 1 Day Post-Op   Principal Problem:   Primary localized osteoarthritis of right knee Active Problems:   Atrial fibrillation (HCC)   Chronic systolic HF (heart failure) (HCC)   S/P TKR (total knee replacement), right   Advance diet Up with therapy WBAT Dry dressing PRN Plan for home later today if does well PT    [name] 02/26/2019, 8:39 AM  Spoken with him and agree with above.  Possible dc home today.   Ronnie Bond, MD Cell [phone]

## 2019-02-26 NOTE — Evaluation (Signed)
Physical Therapy Evaluation Patient Details Name: Ronnie Booth MRN: [id number] DOB: 30-Oct-1945 Today's Date: 02/26/2019   History of Present Illness  s/p R TKA.Marland Kitchen PMH: reverse R TKA, L TKA  Clinical Impression  Pt is s/p TKA resulting in the deficits listed below (see PT Problem List).  Pt unsteady initially with amb, will see again in pm. May benefit from another day depending on progress  Pt will benefit from skilled PT to increase their independence and safety with mobility to allow discharge to the venue listed below.      Follow Up Recommendations Follow surgeon's recommendation for DC plan and follow-up therapies    Equipment Recommendations  Rolling walker with 5" wheels    Recommendations for Other Services       Precautions / Restrictions Precautions Precautions: Fall;Knee Required Braces or Orthoses: Knee Immobilizer - Right Restrictions Other Position/Activity Restrictions: WBAT      Mobility  Bed Mobility Overal bed mobility: Needs Assistance Bed Mobility: Supine to Sit     Supine to sit: Min assist     General bed mobility comments: assist with RLE  Transfers Overall transfer level: Needs assistance Equipment used: Rolling walker (2 wheeled) Transfers: Sit to/from Stand Sit to Stand: Min assist         General transfer comment: cues for hand  placement  Ambulation/Gait Ambulation/Gait assistance: Min assist;Min guard Gait Distance (Feet): 60 Feet Assistive device: Rolling walker (2 wheeled) Gait Pattern/deviations: Step-to pattern;Decreased weight shift to right     General Gait Details: cues for sequence, unsteady initially, stabilityimproved with distance  Stairs            Wheelchair Mobility    Modified Rankin (Stroke Patients Only)       Balance                                             Pertinent Vitals/Pain Pain Assessment: 0-10 Pain Score: 2  Pain Location: right knee Pain Descriptors /  Indicators: Sore Pain Intervention(s): Limited activity within patient's tolerance;Monitored during session    Home Living Family/patient expects to be discharged to:: Private residence Living Arrangements: Spouse/significant other Available Help at Discharge: Family;Available 24 hours/day Type of Home: House Home Access: Stairs to enter   [employer] of Steps: 1 or 3 Home Layout: One level Home Equipment: Walker - 4 wheels      Prior Function Level of Independence: Independent               Hand Dominance        Extremity/Trunk Assessment   Upper Extremity Assessment Upper Extremity Assessment: Overall WFL for tasks assessed    Lower Extremity Assessment Lower Extremity Assessment: RLE deficits/detail RLE Deficits / Details: ankle WFL, knee and hip grossly 2+/5, limited by post op pain adn weakness       Communication   Communication: No difficulties  Cognition Arousal/Alertness: Awake/alert Behavior During Therapy: WFL for tasks assessed/performed Overall Cognitive Status: Within Functional Limits for tasks assessed                                        General Comments      Exercises Total Joint Exercises Ankle Circles/Pumps: AROM;10 reps;Both Quad Sets: 5 reps;AROM;Both   Assessment/Plan    PT Assessment  Patient needs continued PT services  PT Problem List Decreased strength;Decreased range of motion;Decreased activity tolerance;Pain;Decreased mobility;Decreased knowledge of use of DME       PT Treatment Interventions DME instruction;Gait training;Functional mobility training;Stair training;Therapeutic activities;Therapeutic exercise;Patient/family education    PT Goals (Current goals can be found in the Care Plan section)  Acute Rehab PT Goals PT Goal Formulation: With patient Time For Goal Achievement: 03/05/19 Potential to Achieve Goals: Good    Frequency     Barriers to discharge        Co-evaluation                AM-PAC PT "6 Clicks" Mobility  Outcome Measure Help needed turning from your back to your side while in a flat bed without using bedrails?: A Little Help needed moving from lying on your back to sitting on the side of a flat bed without using bedrails?: A Little Help needed moving to and from a bed to a chair (including a wheelchair)?: A Little Help needed standing up from a chair using your arms (e.g., wheelchair or bedside chair)?: A Little Help needed to walk in hospital room?: A Little Help needed climbing 3-5 steps with a railing? : A Little 6 Click Score: 18    End of Session Equipment Utilized During Treatment: Gait belt;Right knee immobilizer Activity Tolerance: Patient tolerated treatment well Patient left: in chair;with call bell/phone within reach;with chair alarm set Nurse Communication: Mobility status PT Visit Diagnosis: Difficulty in walking, not elsewhere classified (R26.2)    Time: 7903-8333 PT Time Calculation (min) (ACUTE ONLY): 27 min   Charges:   PT Evaluation $PT Eval Low Complexity: 1 Low PT Treatments $Gait Training: 8-22 mins        Kenyon Ana, PT  Pager: [phone] Acute Rehab Dept [facility]): [phone]   02/26/2019   [facility] 02/26/2019, 1:50 PM

## 2019-02-26 NOTE — Plan of Care (Signed)

## 2019-02-26 NOTE — Progress Notes (Signed)
Discharge Plan of Care: Prearranged [facility] Has DME (Walker and 3 in 1)

## 2019-02-26 NOTE — Anesthesia Postprocedure Evaluation (Signed)
Anesthesia Post Note  Patient: Ronnie Booth  Procedure(s) Performed: TOTAL KNEE ARTHROPLASTY AND HARDWARE REMOVAL (Right Knee)     Patient location during evaluation: PACU Anesthesia Type: General Level of consciousness: awake and alert Pain management: pain level controlled Vital Signs Assessment: post-procedure vital signs reviewed and stable Respiratory status: spontaneous breathing, nonlabored ventilation, respiratory function stable and patient connected to nasal cannula oxygen Cardiovascular status: blood pressure returned to baseline and stable Postop Assessment: no apparent nausea or vomiting Anesthetic complications: no    Last Vitals:  Vitals:   02/26/19 0051 02/26/19 0459  BP: (!) 136/92 133/90  Pulse: 78 79  Resp: 20 20  Temp: (!) 36.3 C (!) 36.4 C  SpO2: 99% 97%    Last Pain:  Vitals:   02/26/19 0739  TempSrc:   PainSc: 3                  Bekki Tavenner S

## 2019-02-26 NOTE — Plan of Care (Signed)

## 2019-02-26 NOTE — Discharge Summary (Signed)
Physician Discharge Summary  Patient ID: Ronnie Booth MRN: [id number] DOB/AGE: 75-Jul-1947 75 y.o.  Admit date: 02/25/2019 Discharge date: 02/26/2019  Admission Diagnoses:  Primary localized osteoarthritis of right knee  Discharge Diagnoses:  Principal Problem:   Primary localized osteoarthritis of right knee Active Problems:   Atrial fibrillation (HCC)   Chronic systolic HF (heart failure) (HCC)   S/P TKR (total knee replacement), right   Past Medical History:  Diagnosis Date  . AR (aortic regurgitation) 02/11/2018   Mild, Noted on ECHO  . Arthritis   . Atrial fibrillation by electrocardiogram ([place]) 10/14/2018  . Cardiomyopathy ([place])   . Dyslipidemia   . Dyspnea 02/2017   in A. Fib  . Heart murmur    History of murmur in high school, undetected at this time  . History of kidney stones   . Hypertension   . LAE (left atrial enlargement) 02/11/2018   Severly dilated, Noted on ECHO  . LVH (left ventricular hypertrophy) 02/11/2018   Mild, Noted on ECHO  . Medical history reviewed with no changes   . MR (mitral regurgitation) 02/11/2018   Mild, Noted on ECHO  . Primary localized osteoarthritis of right knee 02/25/2019  . Prostate cancer [facility])     Surgeries: Procedure(s): TOTAL KNEE ARTHROPLASTY AND HARDWARE REMOVAL on 02/25/2019   Consultants (if any):   Discharged Condition: Improved  Hospital Course: Ronnie Booth is an 73 y.o. male who was admitted 02/25/2019 with a diagnosis of Primary localized osteoarthritis of right knee and went to the operating room on 02/25/2019 and underwent the above named procedures.    He was given perioperative antibiotics:  Anti-infectives (From admission, onward)   Start     Dose/Rate Route Frequency Ordered Stop   02/25/19 2200  ceFAZolin (ANCEF) IVPB 2g/100 mL premix     2 g 200 mL/hr over 30 Minutes Intravenous Every 6 hours 02/25/19 1959 02/26/19 0352   02/25/19 1045  ceFAZolin (ANCEF) IVPB 2g/100 mL premix     2 g 200  mL/hr over 30 Minutes Intravenous On call to O.R. 02/25/19 1036 02/25/19 1533    .  He was given sequential compression devices, early ambulation, and xarelto for DVT prophylaxis.  He benefited maximally from the hospital stay and there were no complications.    Recent vital signs:  Vitals:   02/26/19 0051 02/26/19 0459  BP: (!) 136/92 133/90  Pulse: 78 79  Resp: 20 20  Temp: (!) 97.3 F (36.3 C) (!) 97.5 F (36.4 C)  SpO2: 99% 97%    Recent laboratory studies:  Lab Results  Component Value Date   HGB 13.5 02/26/2019   HGB 14.6 02/18/2019   HGB 16.7 10/31/2018   Lab Results  Component Value Date   WBC 8.8 02/26/2019   PLT 196 02/26/2019   Lab Results  Component Value Date   INR 1.3 (H) 03/09/2017   Lab Results  Component Value Date   NA 135 02/26/2019   K 4.6 02/26/2019   CL 103 02/26/2019   CO2 22 02/26/2019   BUN 21 02/26/2019   CREATININE 0.99 02/26/2019   GLUCOSE 177 (H) 02/26/2019    Discharge Medications:   Allergies as of 02/26/2019   No Known Allergies     Medication List    TAKE these medications   baclofen 10 MG tablet Commonly known as: LIORESAL Take 1 tablet (10 mg total) by mouth 3 (three) times daily. As needed for muscle spasm   bisoprolol-hydrochlorothiazide 10-6.25 MG tablet Commonly known  asDarrin Luis Take 1 tablet by mouth every morning.   Cartia XT 120 MG 24 hr capsule Generic drug: diltiazem Take 120 mg by mouth daily.   diclofenac sodium 1 % Gel Commonly known as: VOLTAREN Apply topically 4 (four) times daily.   digoxin 0.125 MG tablet Commonly known as: LANOXIN TAKE ONE TABLET BY MOUTH DAILY   Entresto 97-103 MG Generic drug: sacubitril-valsartan TAKE ONE TABLET BY MOUTH TWICE A DAY   Fish Oil 1200 MG Caps Take 1,200 mg by mouth daily.   Magnesium 250 MG Tabs Take 250 mg by mouth daily.   ondansetron 4 MG tablet Commonly known as: Zofran Take 1 tablet (4 mg total) by mouth every 8 (eight) hours as needed for  nausea or vomiting.   oxyCODONE 5 MG immediate release tablet Commonly known as: Roxicodone Take 1 tablet (5 mg total) by mouth every 4 (four) hours as needed for severe pain.   Potassium Gluconate 550 (90 K) MG Tabs Take 550 mg by mouth daily.   rivaroxaban 20 MG Tabs tablet Commonly known as: XARELTO Take 1 tablet (20 mg total) by mouth daily with supper.   sennosides-docusate sodium 8.6-50 MG tablet Commonly known as: SENOKOT-S Take 2 tablets by mouth daily.   SUPER B COMPLEX PO Take 1 tablet by mouth daily.   Vitamin D3 50 MCG (2000 UT) Tabs Take 2,000 Units by mouth daily.       Diagnostic Studies: Dg Knee Right Port  Result Date: 02/25/2019 CLINICAL DATA:  Status post total right knee arthroplasty. EXAM: PORTABLE RIGHT KNEE - 1-2 VIEW COMPARISON:  None. FINDINGS: Left knee prosthetic components appear well seated and well aligned. There is no acute fracture or evidence of an operative complication. IMPRESSION: Well-positioned total right knee arthroplasty. Electronically Signed   By: Lajean Manes M.D.   On: 02/25/2019 19:32    Disposition:     Follow-up Information    [name], MD. Schedule an appointment as soon as possible for a visit in 2 weeks.   Specialty: Orthopedic Surgery Contact information: [address] [place] [address] [phone]            Signed: Johnny Bridge 02/26/2019, 9:01 AM

## 2019-02-27 LAB — CBC
HCT: 39.4 % (ref 39.0–52.0)
Hemoglobin: 12.5 g/dL — ABNORMAL LOW (ref 13.0–17.0)
MCH: 30.3 pg (ref 26.0–34.0)
MCHC: 31.7 g/dL (ref 30.0–36.0)
MCV: 95.6 fL (ref 80.0–100.0)
Platelets: 190 10*3/uL (ref 150–400)
RBC: 4.12 MIL/uL — ABNORMAL LOW (ref 4.22–5.81)
RDW: 13.7 % (ref 11.5–15.5)
WBC: 11.8 10*3/uL — ABNORMAL HIGH (ref 4.0–10.5)
nRBC: 0 % (ref 0.0–0.2)

## 2019-02-27 NOTE — Plan of Care (Signed)

## 2019-02-27 NOTE — Progress Notes (Addendum)
Patient ID: Ronnie Booth, male   DOB: September 08, 1945, 73 y.o.   MRN: [id number]     Subjective:  Patient reports pain as mild.  Patient in bed and reports improvement   Objective:   VITALS:   Vitals:   02/26/19 0934 02/26/19 1428 02/26/19 2116 02/27/19 0623  BP: 116/85 128/83 117/86 (!) 150/98  Pulse: 68 90 80 86  Resp: 15 16 18 18   Temp: 97.8 F (36.6 C) 97.6 F (36.4 C) 97.9 F (36.6 C) 97.9 F (36.6 C)  TempSrc: Oral Oral  Oral  SpO2: 97% 96% 97% 99%  Weight:      Height:        ABD soft Sensation intact distally Dorsiflexion/Plantar flexion intact Incision: dressing C/D/I and no drainage   Lab Results  Component Value Date   WBC 11.8 (H) 02/27/2019   HGB 12.5 (L) 02/27/2019   HCT 39.4 02/27/2019   MCV 95.6 02/27/2019   PLT 190 02/27/2019   BMET    Component Value Date/Time   NA 135 02/26/2019 0335   NA 141 11/13/2017 0807   K 4.6 02/26/2019 0335   CL 103 02/26/2019 0335   CO2 22 02/26/2019 0335   GLUCOSE 177 (H) 02/26/2019 0335   BUN 21 02/26/2019 0335   BUN 28 (H) 11/13/2017 0807   CREATININE 0.99 02/26/2019 0335   CALCIUM 8.4 (L) 02/26/2019 0335   GFRNONAA >60 02/26/2019 0335   GFRAA >60 02/26/2019 0335     Assessment/Plan: 2 Days Post-Op   Principal Problem:   Primary localized osteoarthritis of right knee Active Problems:   Atrial fibrillation (HCC)   Chronic systolic HF (heart failure) (HCC)   S/P TKR (total knee replacement), right   Advance diet Up with therapy Discharge home with home health WBAT Dry dressing PRN Follow up with Dr Mardelle Matte as scheduled   [name] 02/27/2019, 6:59 AM  Discussed and agree with above.   [name], MD Cell [phone]

## 2019-02-27 NOTE — Progress Notes (Signed)
Physical Therapy Treatment Patient Details Name: Ronnie Booth MRN: [id number] DOB: 01-18-1946 Today's Date: 02/27/2019    History of Present Illness s/p R TKA.Marland Kitchen PMH: reverse R TKA, L TKA    PT Comments    POD # 2 Assisted OOB to amb a greater distance in hallway.  Practiced one step.  >te Addressed all mobility questions, discussed appropriate activity, educated on use of ICE.  Pt ready for D/C to home.   Follow Up Recommendations  Home health PT     Equipment Recommendations  Rolling walker with 5" wheels    Recommendations for Other Services       Precautions / Restrictions Precautions Precautions: Fall;Knee Precaution Comments: pt able SLR Restrictions Weight Bearing Restrictions: No    Mobility  Bed Mobility Overal bed mobility: Needs Assistance Bed Mobility: Supine to Sit     Supine to sit: Supervision Sit to supine: Supervision   General bed mobility comments: pt self able crossing LE to self perform  Transfers Overall transfer level: Needs assistance Equipment used: Rolling walker (2 wheeled) Transfers: Sit to/from Stand Sit to Stand: Supervision;Min guard         General transfer comment: < 25% VC's on safety with turns  Ambulation/Gait Ambulation/Gait assistance: Min guard;Min assist Gait Distance (Feet): 75 Feet Assistive device: Rolling walker (2 wheeled) Gait Pattern/deviations: Step-to pattern;Decreased weight shift to right Gait velocity: decreased   General Gait Details: decreased amb distance due to increased c/o fatigue.  Mild unsteady   Stairs Stairs: Yes Stairs assistance: Supervision;Min guard Stair Management: No rails;Step to pattern;Forwards Number of Stairs: 1 General stair comments: <25% VC's on proper tech   Wheelchair Mobility    Modified Rankin (Stroke Patients Only)       Balance                                            Cognition Arousal/Alertness: Awake/alert Behavior During  Therapy: WFL for tasks assessed/performed Overall Cognitive Status: Within Functional Limits for tasks assessed                                 General Comments: very sharpe      Exercises   Total Knee Replacement TE's 10 reps B LE ankle pumps 10 reps towel squeezes 10 reps knee presses 10 reps heel slides  10 reps SAQ's 10 reps SLR's 10 reps ABD Followed by ICE    General Comments        Pertinent Vitals/Pain Pain Assessment: 0-10 Pain Score: 8  Pain Location: right knee Pain Descriptors / Indicators: Discomfort;Tender;Sore;Operative site guarding Pain Intervention(s): Repositioned;Monitored during session;Ice applied    Home Living                      Prior Function            PT Goals (current goals can now be found in the care plan section)      Frequency    7X/week      PT Plan Current plan remains appropriate    Co-evaluation              AM-PAC PT "6 Clicks" Mobility   Outcome Measure  Help needed turning from your back to your side while in a flat bed without using bedrails?: A Little  Help needed moving from lying on your back to sitting on the side of a flat bed without using bedrails?: A Little Help needed moving to and from a bed to a chair (including a wheelchair)?: A Little Help needed standing up from a chair using your arms (e.g., wheelchair or bedside chair)?: A Little Help needed to walk in hospital room?: A Little Help needed climbing 3-5 steps with a railing? : A Little 6 Click Score: 18    End of Session Equipment Utilized During Treatment: Gait belt Activity Tolerance: Patient tolerated treatment well Patient left: in bed;with call bell/phone within reach;with bed alarm set Nurse Communication: Mobility status(pt ready for D/C to home) PT Visit Diagnosis: Difficulty in walking, not elsewhere classified (R26.2)     Time: 0930-1010 PT Time Calculation (min) (ACUTE ONLY): 40 min  Charges:  $Gait  Training: 8-22 mins $Therapeutic Exercise: 8-22 mins $Therapeutic Activity: 8-22 mins                     Rica Koyanagi  PTA Acute  Rehabilitation Services Pager      [phone] Office      [phone]

## 2019-02-27 NOTE — TOC Transition Note (Signed)
Transition of Care [facility]) - CM/SW Discharge Note   Patient Details  Name: Ronnie Booth MRN: [id number] Date of Birth: 02-08-1946  Transition of Care [facility]) CM/SW Contact:  Leeroy Cha, RN Phone Number: 02/27/2019, 10:13 AM   Clinical Narrative:    [facility]   Final next level of care: [place] Barriers to Discharge: No Barriers Identified   Patient Goals and CMS Choice Patient states their goals for this hospitalization and ongoing recovery are:: to go home CMS Medicare.gov Compare Post Acute Care list provided to:: Patient Choice offered to / list presented to : Patient  Discharge Placement                       Discharge Plan and Services   Discharge Planning Services: CM Consult Post Acute Care Choice: Durable Medical Equipment, Home Health                    HH Arranged: PT [place]: [facility] [employer] Date [place]: 02/27/19 Time HH Agency Contacted: 0900 Representative spoke with at [place]: mike  Social Determinants of Health ([place]) Interventions     Readmission Risk Interventions No flowsheet data found.

## 2019-02-27 NOTE — Plan of Care (Signed)
  Problem: Education: Goal: Knowledge of General Education information will improve Description: Including pain rating scale, medication(s)/side effects and non-pharmacologic comfort measures 02/27/2019 1133 by Hubert Azure, RN Outcome: Adequate for Discharge 02/27/2019 1035 by Hubert Azure, RN Outcome: Adequate for Discharge   Problem: Health Behavior/Discharge Planning: Goal: Ability to manage health-related needs will improve 02/27/2019 1133 by Hubert Azure, RN Outcome: Adequate for Discharge 02/27/2019 1035 by Hubert Azure, RN Outcome: Adequate for Discharge   Problem: Clinical Measurements: Goal: Ability to maintain clinical measurements within normal limits will improve 02/27/2019 1133 by Hubert Azure, RN Outcome: Adequate for Discharge 02/27/2019 1035 by Hubert Azure, RN Outcome: Adequate for Discharge Goal: Will remain free from infection 02/27/2019 1133 by Hubert Azure, RN Outcome: Adequate for Discharge 02/27/2019 1035 by Hubert Azure, RN Outcome: Adequate for Discharge Goal: Diagnostic test results will improve 02/27/2019 1133 by Hubert Azure, RN Outcome: Adequate for Discharge 02/27/2019 1035 by Hubert Azure, RN Outcome: Adequate for Discharge Goal: Respiratory complications will improve 02/27/2019 1133 by Hubert Azure, RN Outcome: Adequate for Discharge 02/27/2019 1035 by Hubert Azure, RN Outcome: Adequate for Discharge Goal: Cardiovascular complication will be avoided 02/27/2019 1133 by Hubert Azure, RN Outcome: Adequate for Discharge 02/27/2019 1035 by Hubert Azure, RN Outcome: Adequate for Discharge   Problem: Activity: Goal: Risk for activity intolerance will decrease 02/27/2019 1133 by Hubert Azure, RN Outcome: Adequate for Discharge 02/27/2019 1035 by Hubert Azure, RN Outcome: Adequate for Discharge   Problem: Nutrition: Goal: Adequate nutrition will be maintained 02/27/2019 1133 by Hubert Azure, RN Outcome: Adequate for  Discharge 02/27/2019 1035 by Hubert Azure, RN Outcome: Adequate for Discharge   Problem: Coping: Goal: Level of anxiety will decrease 02/27/2019 1133 by Hubert Azure, RN Outcome: Adequate for Discharge 02/27/2019 1035 by Hubert Azure, RN Outcome: Adequate for Discharge   Problem: Elimination: Goal: Will not experience complications related to bowel motility 02/27/2019 1133 by Hubert Azure, RN Outcome: Adequate for Discharge 02/27/2019 1035 by Hubert Azure, RN Outcome: Adequate for Discharge Goal: Will not experience complications related to urinary retention 02/27/2019 1133 by Hubert Azure, RN Outcome: Adequate for Discharge 02/27/2019 1035 by Hubert Azure, RN Outcome: Adequate for Discharge   Problem: Pain Managment: Goal: General experience of comfort will improve 02/27/2019 1133 by Hubert Azure, RN Outcome: Adequate for Discharge 02/27/2019 1035 by Hubert Azure, RN Outcome: Adequate for Discharge   Problem: Safety: Goal: Ability to remain free from injury will improve 02/27/2019 1133 by Hubert Azure, RN Outcome: Adequate for Discharge 02/27/2019 1035 by Hubert Azure, RN Outcome: Adequate for Discharge   Problem: Skin Integrity: Goal: Risk for impaired skin integrity will decrease 02/27/2019 1133 by Hubert Azure, RN Outcome: Adequate for Discharge 02/27/2019 1035 by Hubert Azure, RN Outcome: Adequate for Discharge

## 2019-02-28 DIAGNOSIS — Z96651 Presence of right artificial knee joint: Secondary | ICD-10-CM | POA: Diagnosis not present

## 2019-03-03 DIAGNOSIS — Z471 Aftercare following joint replacement surgery: Secondary | ICD-10-CM | POA: Diagnosis not present

## 2019-03-03 DIAGNOSIS — I5022 Chronic systolic (congestive) heart failure: Secondary | ICD-10-CM | POA: Diagnosis not present

## 2019-03-03 DIAGNOSIS — E785 Hyperlipidemia, unspecified: Secondary | ICD-10-CM | POA: Diagnosis not present

## 2019-03-03 DIAGNOSIS — Z96653 Presence of artificial knee joint, bilateral: Secondary | ICD-10-CM | POA: Diagnosis not present

## 2019-03-03 DIAGNOSIS — Z9181 History of falling: Secondary | ICD-10-CM | POA: Diagnosis not present

## 2019-03-03 DIAGNOSIS — I429 Cardiomyopathy, unspecified: Secondary | ICD-10-CM | POA: Diagnosis not present

## 2019-03-03 DIAGNOSIS — I4891 Unspecified atrial fibrillation: Secondary | ICD-10-CM | POA: Diagnosis not present

## 2019-03-03 DIAGNOSIS — Z8546 Personal history of malignant neoplasm of prostate: Secondary | ICD-10-CM | POA: Diagnosis not present

## 2019-03-03 DIAGNOSIS — I110 Hypertensive heart disease with heart failure: Secondary | ICD-10-CM | POA: Diagnosis not present

## 2019-03-10 DIAGNOSIS — Z96651 Presence of right artificial knee joint: Secondary | ICD-10-CM | POA: Diagnosis not present

## 2019-03-17 DIAGNOSIS — R262 Difficulty in walking, not elsewhere classified: Secondary | ICD-10-CM | POA: Diagnosis not present

## 2019-03-17 DIAGNOSIS — M25661 Stiffness of right knee, not elsewhere classified: Secondary | ICD-10-CM | POA: Diagnosis not present

## 2019-03-17 DIAGNOSIS — M6281 Muscle weakness (generalized): Secondary | ICD-10-CM | POA: Diagnosis not present

## 2019-03-18 ENCOUNTER — Telehealth: Payer: Self-pay | Admitting: Cardiology

## 2019-03-18 NOTE — Telephone Encounter (Signed)
Spoke with patient - he has noted that his morning BP readings are usually 060-156 systolic, but tend to drop later in the mornings.  Normally takes bisoprolol/hctz 10/6.25 and Entresto 97/103 each morning, then second dose of Entresto at night.  Low BP readings make him feel washed out and dizzy with positional changes.    Advised for the next 7-10 days he cut the Entresto tablets in half.  Continue with Ziac.   Call back in 7-10 days and let us know how he's doing. May need to permanently decrease Entresto dose.   Patient voiced understanding

## 2019-03-18 NOTE — Telephone Encounter (Signed)
° °  Wife of the patient called back. After disconnecting with Erasmo Downer, she realized she had one more question for her. If Erasmo Downer could call the patient's wife Ronnie Booth, she would appreciate it.

## 2019-03-18 NOTE — Telephone Encounter (Signed)
Wife with additional questions for pharmacist regarding medications

## 2019-03-18 NOTE — Telephone Encounter (Signed)
Pt c/o BP issue: STAT if pt c/o blurred vision, one-sided weakness or slurred speech  1. What are your last 5 BP readings? 100/60      85/58 Sunday      73/50 Monday 177/81 in the morning       11 9/75 this morning   2. Are you having any other symptoms (ex. Dizziness, headache, blurred vision, passed out)? Lightheaded, dizzy.   3. What is your BP issue? Patient states his BP has been low lately. Yesterday and today he has not take his BP medication.  However yesterday after PT he states his BP did drop.

## 2019-03-18 NOTE — Telephone Encounter (Signed)
Pt forgot to ask if he should continue Ziac.  Asked that he continue with that for now.  Wife voiced understanding.

## 2019-03-18 NOTE — Telephone Encounter (Signed)
Pt reports developing low BP's s/p knee replacement on 02/25/19. His usual BP's are 120-130/80-90 and recently, he reports BP's as low as 100/50, 85/58, 73/50. He feels dizzy and lightheadedness correlating with these low readings. Other times, BP's are WNL 117/81, 119/75.    Medication list reconciled, taking all as ordered.    pls advise, tx

## 2019-03-19 ENCOUNTER — Other Ambulatory Visit: Payer: Self-pay

## 2019-03-19 ENCOUNTER — Ambulatory Visit
Admission: RE | Admit: 2019-03-19 | Discharge: 2019-03-19 | Disposition: A | Discharge status: Home / Self Care | Payer: Medicare Other | Source: Ambulatory Visit | Attending: Urology | Admitting: Urology

## 2019-03-19 DIAGNOSIS — M8589 Other specified disorders of bone density and structure, multiple sites: Secondary | ICD-10-CM | POA: Diagnosis not present

## 2019-03-19 DIAGNOSIS — M8580 Other specified disorders of bone density and structure, unspecified site: Secondary | ICD-10-CM

## 2019-03-24 ENCOUNTER — Telehealth: Payer: Self-pay | Admitting: Cardiology

## 2019-03-24 DIAGNOSIS — R262 Difficulty in walking, not elsewhere classified: Secondary | ICD-10-CM | POA: Diagnosis not present

## 2019-03-24 DIAGNOSIS — M25661 Stiffness of right knee, not elsewhere classified: Secondary | ICD-10-CM | POA: Diagnosis not present

## 2019-03-24 DIAGNOSIS — M6281 Muscle weakness (generalized): Secondary | ICD-10-CM | POA: Diagnosis not present

## 2019-03-24 NOTE — Telephone Encounter (Signed)
Patient was told last week to cut entresto 97-103mg  BID tablets in HALF. Per verbal clarification with pharmacist, patient should continue with HALF tablet entresto and then HALF tablet Ziac  Wife advised on this.  Med list updated

## 2019-03-24 NOTE — Telephone Encounter (Signed)
Please STOP taking bisoprolol-HCTZ (Ziac) for now.  Continue high dose Entresto and continue to monitor BP. Call back if BP not resolved after stopping Ziac.

## 2019-03-24 NOTE — Telephone Encounter (Signed)
  Pt c/o medication issue:  1. Name of Medication: ENTRESTO 97-103 MG, bisoprolol-hydrochlorothiazide (ZIAC) 10-6.25 MG tablet  2. How are you currently taking this medication (dosage and times per day)? As listed  3. Are you having a reaction (difficulty breathing--STAT)? No  4. What is your medication issue? States his blood pressure keeps dropping, passed out for a moment, BP was 90/67 when they got home but it was 85/58 at one point. Wife states BP was 113/80 which was before he took his medication.

## 2019-03-26 NOTE — Telephone Encounter (Signed)
No.  When he gets a refill it can be for the Ziac 5/6.25 take one daily and the Entresto 49/51 mg po bid.

## 2019-03-27 DIAGNOSIS — M8580 Other specified disorders of bone density and structure, unspecified site: Secondary | ICD-10-CM | POA: Diagnosis not present

## 2019-03-27 DIAGNOSIS — C775 Secondary and unspecified malignant neoplasm of intrapelvic lymph nodes: Secondary | ICD-10-CM | POA: Diagnosis not present

## 2019-03-27 DIAGNOSIS — M6281 Muscle weakness (generalized): Secondary | ICD-10-CM | POA: Diagnosis not present

## 2019-03-27 DIAGNOSIS — N200 Calculus of kidney: Secondary | ICD-10-CM | POA: Diagnosis not present

## 2019-03-27 DIAGNOSIS — C61 Malignant neoplasm of prostate: Secondary | ICD-10-CM | POA: Diagnosis not present

## 2019-03-27 DIAGNOSIS — M25661 Stiffness of right knee, not elsewhere classified: Secondary | ICD-10-CM | POA: Diagnosis not present

## 2019-03-27 DIAGNOSIS — R262 Difficulty in walking, not elsewhere classified: Secondary | ICD-10-CM | POA: Diagnosis not present

## 2019-03-28 NOTE — Telephone Encounter (Signed)
LM on [phone] to discuss MD suggestion

## 2019-03-31 ENCOUNTER — Other Ambulatory Visit: Payer: Self-pay | Admitting: Cardiology

## 2019-03-31 DIAGNOSIS — M25661 Stiffness of right knee, not elsewhere classified: Secondary | ICD-10-CM | POA: Diagnosis not present

## 2019-03-31 DIAGNOSIS — R262 Difficulty in walking, not elsewhere classified: Secondary | ICD-10-CM | POA: Diagnosis not present

## 2019-03-31 DIAGNOSIS — M6281 Muscle weakness (generalized): Secondary | ICD-10-CM | POA: Diagnosis not present

## 2019-03-31 NOTE — Telephone Encounter (Signed)
Spoke with pt, aware of dr hochrein;s recommendations. He has been taking 1/2 of the 10/6.25 mg tablet and feels good and his blood pressure has been fine. He will continue on the 1/2 of the 10/6.25 mg ziac tablet. Will make dr hochrein aware.

## 2019-03-31 NOTE — Telephone Encounter (Signed)
Follow up  ° ° °Pt is returning call  ° ° °Please call back  °

## 2019-04-03 DIAGNOSIS — M25661 Stiffness of right knee, not elsewhere classified: Secondary | ICD-10-CM | POA: Diagnosis not present

## 2019-04-03 DIAGNOSIS — R262 Difficulty in walking, not elsewhere classified: Secondary | ICD-10-CM | POA: Diagnosis not present

## 2019-04-03 DIAGNOSIS — M6281 Muscle weakness (generalized): Secondary | ICD-10-CM | POA: Diagnosis not present

## 2019-04-04 DIAGNOSIS — H2513 Age-related nuclear cataract, bilateral: Secondary | ICD-10-CM | POA: Diagnosis not present

## 2019-04-04 DIAGNOSIS — H17822 Peripheral opacity of cornea, left eye: Secondary | ICD-10-CM | POA: Diagnosis not present

## 2019-04-04 DIAGNOSIS — H43813 Vitreous degeneration, bilateral: Secondary | ICD-10-CM | POA: Diagnosis not present

## 2019-04-07 DIAGNOSIS — R262 Difficulty in walking, not elsewhere classified: Secondary | ICD-10-CM | POA: Diagnosis not present

## 2019-04-07 DIAGNOSIS — M6281 Muscle weakness (generalized): Secondary | ICD-10-CM | POA: Diagnosis not present

## 2019-04-07 DIAGNOSIS — M25661 Stiffness of right knee, not elsewhere classified: Secondary | ICD-10-CM | POA: Diagnosis not present

## 2019-04-10 DIAGNOSIS — R262 Difficulty in walking, not elsewhere classified: Secondary | ICD-10-CM | POA: Diagnosis not present

## 2019-04-10 DIAGNOSIS — M6281 Muscle weakness (generalized): Secondary | ICD-10-CM | POA: Diagnosis not present

## 2019-04-10 DIAGNOSIS — M25661 Stiffness of right knee, not elsewhere classified: Secondary | ICD-10-CM | POA: Diagnosis not present

## 2019-04-14 DIAGNOSIS — M25661 Stiffness of right knee, not elsewhere classified: Secondary | ICD-10-CM | POA: Diagnosis not present

## 2019-04-14 DIAGNOSIS — R262 Difficulty in walking, not elsewhere classified: Secondary | ICD-10-CM | POA: Diagnosis not present

## 2019-04-14 DIAGNOSIS — M6281 Muscle weakness (generalized): Secondary | ICD-10-CM | POA: Diagnosis not present

## 2019-04-17 DIAGNOSIS — M6281 Muscle weakness (generalized): Secondary | ICD-10-CM | POA: Diagnosis not present

## 2019-04-17 DIAGNOSIS — R262 Difficulty in walking, not elsewhere classified: Secondary | ICD-10-CM | POA: Diagnosis not present

## 2019-04-17 DIAGNOSIS — M25661 Stiffness of right knee, not elsewhere classified: Secondary | ICD-10-CM | POA: Diagnosis not present

## 2019-04-21 DIAGNOSIS — M6281 Muscle weakness (generalized): Secondary | ICD-10-CM | POA: Diagnosis not present

## 2019-04-21 DIAGNOSIS — M25661 Stiffness of right knee, not elsewhere classified: Secondary | ICD-10-CM | POA: Diagnosis not present

## 2019-04-21 DIAGNOSIS — R262 Difficulty in walking, not elsewhere classified: Secondary | ICD-10-CM | POA: Diagnosis not present

## 2019-04-24 DIAGNOSIS — M25661 Stiffness of right knee, not elsewhere classified: Secondary | ICD-10-CM | POA: Diagnosis not present

## 2019-04-24 DIAGNOSIS — R262 Difficulty in walking, not elsewhere classified: Secondary | ICD-10-CM | POA: Diagnosis not present

## 2019-04-24 DIAGNOSIS — M6281 Muscle weakness (generalized): Secondary | ICD-10-CM | POA: Diagnosis not present

## 2019-04-29 DIAGNOSIS — M25661 Stiffness of right knee, not elsewhere classified: Secondary | ICD-10-CM | POA: Diagnosis not present

## 2019-04-29 DIAGNOSIS — R262 Difficulty in walking, not elsewhere classified: Secondary | ICD-10-CM | POA: Diagnosis not present

## 2019-04-29 DIAGNOSIS — M6281 Muscle weakness (generalized): Secondary | ICD-10-CM | POA: Diagnosis not present

## 2019-05-06 DIAGNOSIS — M25661 Stiffness of right knee, not elsewhere classified: Secondary | ICD-10-CM | POA: Diagnosis not present

## 2019-05-06 DIAGNOSIS — R262 Difficulty in walking, not elsewhere classified: Secondary | ICD-10-CM | POA: Diagnosis not present

## 2019-05-06 DIAGNOSIS — M6281 Muscle weakness (generalized): Secondary | ICD-10-CM | POA: Diagnosis not present

## 2019-05-29 DIAGNOSIS — Z23 Encounter for immunization: Secondary | ICD-10-CM | POA: Diagnosis not present

## 2019-06-09 ENCOUNTER — Other Ambulatory Visit: Payer: Self-pay

## 2019-06-09 MED ORDER — BISOPROLOL-HYDROCHLOROTHIAZIDE 10-6.25 MG PO TABS: 0.5000 | ORAL_TABLET | Freq: Every day | ORAL | 1 refills | Status: DC

## 2019-06-09 MED ORDER — SACUBITRIL-VALSARTAN 97-103 MG PO TABS: 0.5000 | ORAL_TABLET | Freq: Two times a day (BID) | ORAL | 1 refills | Status: DC

## 2019-06-18 DIAGNOSIS — Z96651 Presence of right artificial knee joint: Secondary | ICD-10-CM | POA: Diagnosis not present

## 2019-06-19 DIAGNOSIS — C61 Malignant neoplasm of prostate: Secondary | ICD-10-CM | POA: Diagnosis not present

## 2019-06-26 DIAGNOSIS — C61 Malignant neoplasm of prostate: Secondary | ICD-10-CM | POA: Diagnosis not present

## 2019-06-26 DIAGNOSIS — M8580 Other specified disorders of bone density and structure, unspecified site: Secondary | ICD-10-CM | POA: Diagnosis not present

## 2019-06-26 DIAGNOSIS — C775 Secondary and unspecified malignant neoplasm of intrapelvic lymph nodes: Secondary | ICD-10-CM | POA: Diagnosis not present

## 2019-09-16 DIAGNOSIS — C61 Malignant neoplasm of prostate: Secondary | ICD-10-CM | POA: Diagnosis not present

## 2019-09-22 ENCOUNTER — Telehealth: Payer: Self-pay | Admitting: Cardiology

## 2019-09-22 NOTE — Telephone Encounter (Signed)
LM2CB regarding chest pain

## 2019-09-22 NOTE — Telephone Encounter (Signed)
Wife of pt called back. Pt made the call at work and wife would need the office to call the patient directly [phone]

## 2019-09-22 NOTE — Telephone Encounter (Signed)
Called pt back. LM2CB

## 2019-09-22 NOTE — Telephone Encounter (Signed)
Follow Up: ° ° ° °Returning your call from this morning. °

## 2019-09-22 NOTE — Telephone Encounter (Signed)
Agree 

## 2019-09-22 NOTE — Telephone Encounter (Signed)
Called patient. Asked about CP. Stated that it comes and goes. Asked where pain was; stated between armpit and heart on the left side. Asked if he was currently having pain, denied. Asked what he does when he has the pain, states he takes a deep breath or lifts something. Asked pt if the pain radiates anywhere, denied. He also states that he can not lay on his left side. Advised pt that it could be muscular but he should keep his appt with Dr. Percival Spanish tomorrow. Advised to call 911 if the pain returns and does not subside.

## 2019-09-22 NOTE — Telephone Encounter (Signed)
Pt c/o of Chest Pain: STAT if CP now or developed within 24 hours  1. Are you having CP right now? No   2. Are you experiencing any other symptoms (ex. SOB, nausea, vomiting, sweating)? No   3. How long have you been experiencing CP? A couple weeks   4. Is your CP continuous or coming and going? Coming and going   5. Have you taken Nitroglycerin? No   Patient is calling stating he has been experiencing CP on and off for the last couple weeks. He states the pain is on his left side starting at breast going down to armpit. No other symptoms occur when CP Is present. Patient is scheduled for an appointment tomorrow 09/23/19 at 3:00 PM regarding it. Please advise.  ?

## 2019-09-22 NOTE — Progress Notes (Signed)
Cardiology Office Note   Date:  09/23/2019   ID:  Ronnie Booth, Ronnie Booth 11/29/1945, MRN [id number]  PCP:  Orpah Melter, MD  Cardiologist:   Minus Breeding, MD   No chief complaint on file.     History of Present Illness: Ronnie Booth is a 74 y.o. male who presents for ongoing assessment and management of atrial fibrillation CHADS VASC Score of 2, and cardiomyopathy. Most recent echo in 01/2018 revealed normal LVEF and severely dilated left atrium. He was last seen in the office on 01/18/2018 and was stable from a cardiac standpoint. We saw him prior to knee replacement.    He has been getting some chest discomfort.  He called to discuss this and was added to the schedule.  This has been sporadic.  It comes and goes.  3 out of 10 in intensity.  Is under the left axilla.  It hurts at times when he has to lift something and certainly move his arm in a certain way.  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  He does not have any associated nausea vomiting or diaphoresis.  He has had no new shortness of breath, PND or orthopnea.  He has had no weight gain or edema.  He has had recurrence of his prostate cancer and he is on hormone therapy.  He thinks this makes him have increased urination at night.  He is not sleeping well and subsequently is tired during the day.    Past Medical History:  Diagnosis Date  . AR (aortic regurgitation) 02/11/2018   Mild, Noted on ECHO  . Arthritis   . Atrial fibrillation by electrocardiogram ([place]) 10/14/2018  . Cardiomyopathy ([place])   . Dyslipidemia   . Dyspnea 02/2017   in [facility] Fib  . Heart murmur    History of murmur in high school, undetected at this time  . History of kidney stones   . Hypertension   . LAE (left atrial enlargement) 02/11/2018   Severly dilated, Noted on ECHO  . LVH (left ventricular hypertrophy) 02/11/2018   Mild, Noted on ECHO  .  Medical history reviewed with no changes   . MR (mitral regurgitation) 02/11/2018   Mild, Noted on ECHO  . Primary localized osteoarthritis of right knee 02/25/2019  . Prostate cancer [facility])     Past Surgical History:  Procedure Laterality Date  . APPENDECTOMY     had internal bleeding and had to go back to surgery  . CARDIOVERSION N/A 03/12/2017   Procedure: CARDIOVERSION;  Surgeon: Sanda Klein, MD;  [facility];  Service: Cardiovascular;  Laterality: N/A;  . COLONOSCOPY    . INGUINAL HERNIA REPAIR Left   . JOINT REPLACEMENT Left    knee  . LITHOTRIPSY    . PROSTATE SURGERY  2002  . REVERSE SHOULDER ARTHROPLASTY Right 01/13/2016   Procedure: RIGHT REVERSE SHOULDER ARTHROPLASTY;  Surgeon: Justice Britain, MD;  Location: [facility];  Service: Orthopedics;  Laterality: Right;  . TOTAL KNEE ARTHROPLASTY Right 02/25/2019   Procedure: TOTAL KNEE ARTHROPLASTY AND HARDWARE REMOVAL;  Surgeon: [name], MD;  Location: [facility];  Service: Orthopedics;  Laterality: Right;     Current Outpatient Medications  Medication Sig Dispense Refill  . B Complex-C (SUPER B COMPLEX PO) Take 1 tablet by mouth daily.    . baclofen (LIORESAL) 10 MG tablet Take 1 tablet (10 mg total) by  mouth 3 (three) times daily. As needed for muscle spasm 50 tablet 0  . bisoprolol-hydrochlorothiazide (ZIAC) 10-6.25 MG tablet Take 0.5 tablets by mouth daily. 90 tablet 1  . CARTIA XT 120 MG 24 hr capsule Take 120 mg by mouth daily.   0  . Cholecalciferol (VITAMIN D3) 2000 units TABS Take 2,000 Units by mouth daily.     . diclofenac sodium (VOLTAREN) 1 % GEL Apply topically 4 (four) times daily.    . digoxin (LANOXIN) 0.125 MG tablet TAKE ONE TABLET BY MOUTH DAILY 90 tablet 1  . ENTRESTO 49-51 MG Take 1 tablet by mouth 2 (two) times daily.    . Magnesium 250 MG TABS Take 250 mg by mouth daily.     . Omega-3 Fatty Acids (FISH OIL) 1200 MG CAPS Take 1,200 mg by mouth daily.     . ondansetron (ZOFRAN) 4 MG tablet Take 1  tablet (4 mg total) by mouth every 8 (eight) hours as needed for nausea or vomiting. 10 tablet 0  . oxyCODONE (ROXICODONE) 5 MG immediate release tablet Take 1 tablet (5 mg total) by mouth every 4 (four) hours as needed for severe pain. 30 tablet 0  . Potassium Gluconate 550 (90 K) MG TABS Take 550 mg by mouth every other day.     . rivaroxaban (XARELTO) 20 MG TABS tablet Take 1 tablet (20 mg total) by mouth daily with supper. 90 tablet 0  . sennosides-docusate sodium (SENOKOT-S) 8.6-50 MG tablet Take 2 tablets by mouth daily. 30 tablet 1   No current facility-administered medications for this visit.    Allergies:   Patient has no known allergies.   ROS:  Please see the history of present illness.   Otherwise, review of systems are positive for none.   All other systems are reviewed and negative.    PHYSICAL EXAM: VS:  BP 118/78   Pulse 69   Ht 6\' 2"  (1.88 m)   Wt 196 lb (88.9 kg)   BMI 25.16 kg/m  , BMI Body mass index is 25.16 kg/m. GENERAL:  Well appearing NECK:  No jugular venous distention, waveform within normal limits, carotid upstroke brisk and symmetric, no bruits, no thyromegaly LUNGS:  Clear to auscultation bilaterally CHEST:  Unremarkable HEART:  PMI not displaced or sustained,S1 and S2 within normal limits, no S3, no clicks, no rubs, no murmurs, irregular.   ABD:  Flat, positive bowel sounds normal in frequency in pitch, no bruits, no rebound, no guarding, no midline pulsatile mass, no hepatomegaly, no splenomegaly EXT:  2 plus pulses throughout, no edema, no cyanosis no clubbing     EKG:  EKG is ordered today. The ekg ordered today demonstrates atrial fibrillation, rate 69, axis within normal, intervals within normal limits, no acute ST-T wave changes.   Recent Labs: 02/26/2019: BUN 21; Creatinine, Ser 0.99; Potassium 4.6; Sodium 135 02/27/2019: Hemoglobin 12.5; Platelets 190    Lipid Panel No results found for: CHOL, TRIG, HDL, CHOLHDL, VLDL, LDLCALC, LDLDIRECT     Wt Readings from Last 3 Encounters:  09/23/19 196 lb (88.9 kg)  02/25/19 194 lb (88 kg)  01/28/19 181 lb (82.1 kg)      Other studies Reviewed: Additional studies/ records that were reviewed today include: Labs and HTN diary. . Review of the above records demonstrates:  Please see elsewhere in the note.     ASSESSMENT AND PLAN:  Atrial Fibrillation:     The patient tolerates anticoagulation has good rate control.  No change in  therapy.   Hypertension: Blood pressures is elevated at times in the 140s to 150s on recent recordings.  However, in the past his blood pressure has been low and we have to back off on his medications.  Therefore, at this time no change in therapy.   Chest pain: His chest pain is atypical.  He had a negative perfusion study a few years ago.  At this point I think the pretest probability of obstructive coronary disease is low and I would not pursue further cardiovascular testing but he can call me if symptoms change in quality or character or intensity.  Cardiomyopathy: His ejection fraction was much improved when it was checked in 2019.  No change in therapy.  As above he would not tolerate med titration.  Covid education: He had his first dose of vaccine today.   Current medicines are reviewed at length with the patient today.  The patient does not have concerns regarding medicines.  The following changes have been made:  no change  Labs/ tests ordered today include: None No orders of the defined types were placed in this encounter.    Disposition:   FU with me in six months.     Signed, Minus Breeding, MD  09/23/2019 3:24 PM    [facility]

## 2019-09-23 ENCOUNTER — Other Ambulatory Visit: Payer: Self-pay

## 2019-09-23 ENCOUNTER — Encounter: Payer: Self-pay | Admitting: Cardiology

## 2019-09-23 ENCOUNTER — Ambulatory Visit: Payer: Medicare Other | Admitting: Cardiology

## 2019-09-23 VITALS — BP 118/78 | HR 69 | Ht 74.0 in | Wt 196.0 lb

## 2019-09-23 DIAGNOSIS — I4821 Permanent atrial fibrillation: Secondary | ICD-10-CM

## 2019-09-23 DIAGNOSIS — I10 Essential (primary) hypertension: Secondary | ICD-10-CM

## 2019-09-23 NOTE — Patient Instructions (Addendum)
Medication Instructions:  No Changes *If you need a refill on your cardiac medications before your next appointment, please call your pharmacy*  Lab Work: None  Testing/Procedures: None  Follow-Up: At [facility], you and your health needs are our priority.  As part of our continuing mission to provide you with exceptional heart care, we have created designated Provider Care Teams.  These Care Teams include your primary Cardiologist (physician) and Advanced Practice Providers (APPs -  Physician Assistants and Nurse Practitioners) who all work together to provide you with the care you need, when you need it.  Your next appointment:   6 month(s)  You will receive a reminder letter in the mail two months in advance. If you don't receive a letter, please call our office to schedule the follow-up appointment.   The format for your next appointment:   In Person  Provider:   Minus Breeding, MD

## 2019-10-02 DIAGNOSIS — C775 Secondary and unspecified malignant neoplasm of intrapelvic lymph nodes: Secondary | ICD-10-CM | POA: Diagnosis not present

## 2019-10-02 DIAGNOSIS — C61 Malignant neoplasm of prostate: Secondary | ICD-10-CM | POA: Diagnosis not present

## 2019-10-02 DIAGNOSIS — N5231 Erectile dysfunction following radical prostatectomy: Secondary | ICD-10-CM | POA: Diagnosis not present

## 2019-10-02 DIAGNOSIS — R351 Nocturia: Secondary | ICD-10-CM | POA: Diagnosis not present

## 2019-10-04 ENCOUNTER — Other Ambulatory Visit: Payer: Self-pay | Admitting: Cardiology

## 2019-10-20 DIAGNOSIS — Z0000 Encounter for general adult medical examination without abnormal findings: Secondary | ICD-10-CM | POA: Diagnosis not present

## 2019-10-20 DIAGNOSIS — I5032 Chronic diastolic (congestive) heart failure: Secondary | ICD-10-CM | POA: Diagnosis not present

## 2019-10-20 DIAGNOSIS — E785 Hyperlipidemia, unspecified: Secondary | ICD-10-CM | POA: Diagnosis not present

## 2019-10-20 DIAGNOSIS — I10 Essential (primary) hypertension: Secondary | ICD-10-CM | POA: Diagnosis not present

## 2019-12-30 DIAGNOSIS — C61 Malignant neoplasm of prostate: Secondary | ICD-10-CM | POA: Diagnosis not present

## 2020-01-02 ENCOUNTER — Other Ambulatory Visit: Payer: Self-pay | Admitting: Cardiology

## 2020-01-02 ENCOUNTER — Telehealth: Payer: Self-pay | Admitting: Cardiology

## 2020-01-02 NOTE — Telephone Encounter (Signed)
patient was at work spoke with spouse, stated patient would call to get appointment

## 2020-01-04 DIAGNOSIS — L03116 Cellulitis of left lower limb: Secondary | ICD-10-CM | POA: Diagnosis not present

## 2020-01-05 ENCOUNTER — Telehealth: Payer: Self-pay | Admitting: Cardiology

## 2020-01-05 NOTE — Telephone Encounter (Signed)
Returned the call to the patient. He stated that a week and a half ago his left ankle became swollen. It then proceeded to get better. On Saturday, in the middle of the night, he noticed that it was swollen again and now painful.  He went to a primary care and was put on antibiotics and steroids. His ankle was painful and warm to touch.  They did advise that the patient follow up with cardiology as well. Appointment with Dr. Percival Spanish on 01/09/20.

## 2020-01-05 NOTE — Telephone Encounter (Signed)
Pt c/o swelling: STAT is pt has developed SOB within 24 hours  1) How much weight have you gained and in what time span? Has not gained weight  2) If swelling, where is the swelling located? Left Ankle   3) Are you currently taking a fluid pill? no  4) Are you currently SOB? no  5) Do you have a log of your daily weights (if so, list)? no  6) Have you gained 3 pounds in a day or 5 pounds in a week? no  7) Have you traveled recently? no  Patient states his left ankle is swollen and he cannot put pressure on it. He states he went to [facility] and they thought it was an infection and gave him pain killers, steroids, and antibiotics to last him a week. He has an appointment 01/09/2020 with Dr. Percival Spanish.

## 2020-01-08 DIAGNOSIS — N200 Calculus of kidney: Secondary | ICD-10-CM | POA: Diagnosis not present

## 2020-01-08 DIAGNOSIS — N281 Cyst of kidney, acquired: Secondary | ICD-10-CM | POA: Diagnosis not present

## 2020-01-08 DIAGNOSIS — I420 Dilated cardiomyopathy: Secondary | ICD-10-CM | POA: Insufficient documentation

## 2020-01-08 DIAGNOSIS — M8580 Other specified disorders of bone density and structure, unspecified site: Secondary | ICD-10-CM | POA: Diagnosis not present

## 2020-01-08 DIAGNOSIS — R079 Chest pain, unspecified: Secondary | ICD-10-CM | POA: Insufficient documentation

## 2020-01-08 DIAGNOSIS — C61 Malignant neoplasm of prostate: Secondary | ICD-10-CM | POA: Diagnosis not present

## 2020-01-08 NOTE — Progress Notes (Signed)
Cardiology Office Note   Date:  01/09/2020   ID:  Ronnie Booth 01/12/1946, MRN [id number]  PCP:  Ronnie Melter, MD  Cardiologist:   Minus Breeding, MD   Chief Complaint  Patient presents with  . Leg Swelling      History of Present Illness: Ronnie Booth is a 74 y.o. male who presents for ongoing assessment and management of atrial fibrillation CHADS VASC Score of 2, and cardiomyopathy. Most recent echo in 01/2018 revealed normal LVEF and severely dilated left atrium.   He comes in today because he had some foot swelling.  He went to see urgent care because about a week ago he woke up in his foot was uncomfortable.  It was so uncomfortable he really could not walk on it.  There was swelling.  He was treated at the urgent care with steroids and antibiotics.  However, because there was some swelling they suggested he come to see the cardiologist.  He has not had any new shortness of breath, PND or orthopnea.  He is never noticed his atrial fibrillation.  He denies any chest pressure, neck or arm discomfort.  He has no weight gain.  He tolerates his anticoagulation.  He is working every day.  Past Medical History:  Diagnosis Date  . AR (aortic regurgitation) 02/11/2018   Mild, Noted on ECHO  . Arthritis   . Atrial fibrillation by electrocardiogram ([place]) 10/14/2018  . Cardiomyopathy ([place])   . Dyslipidemia   . Dyspnea 02/2017   in [facility]  . Heart murmur    History of murmur in high school, undetected at this time  . History of kidney stones   . Hypertension   . LAE (left atrial enlargement) 02/11/2018   Severly dilated, Noted on ECHO  . LVH (left ventricular hypertrophy) 02/11/2018   Mild, Noted on ECHO  . Medical history reviewed with no changes   . MR (mitral regurgitation) 02/11/2018   Mild, Noted on ECHO  . Primary localized osteoarthritis of right knee 02/25/2019  . Prostate cancer [facility])     Past Surgical History:  Procedure Laterality Date  .  APPENDECTOMY     had internal bleeding and had to go back to surgery  . CARDIOVERSION N/A 03/12/2017   Procedure: CARDIOVERSION;  Surgeon: Sanda Klein, MD;  [facility];  Service: Cardiovascular;  Laterality: N/A;  . COLONOSCOPY    . INGUINAL HERNIA REPAIR Left   . JOINT REPLACEMENT Left    knee  . LITHOTRIPSY    . PROSTATE SURGERY  2002  . REVERSE SHOULDER ARTHROPLASTY Right 01/13/2016   Procedure: RIGHT REVERSE SHOULDER ARTHROPLASTY;  Surgeon: Justice Britain, MD;  Location: [facility];  Service: Orthopedics;  Laterality: Right;  . TOTAL KNEE ARTHROPLASTY Right 02/25/2019   Procedure: TOTAL KNEE ARTHROPLASTY AND HARDWARE REMOVAL;  Surgeon: [name], MD;  Location: [facility];  Service: Orthopedics;  Laterality: Right;     Current Outpatient Medications  Medication Sig Dispense Refill  . B Complex-C (SUPER B COMPLEX PO) Take 1 tablet by mouth daily.    . baclofen (LIORESAL) 10 MG tablet Take 1 tablet (10 mg total) by mouth 3 (three) times daily. As needed for muscle spasm 50 tablet 0  . bisoprolol-hydrochlorothiazide (ZIAC) 10-6.25 MG tablet Take 0.5 tablets by mouth daily. 90 tablet 1  . CARTIA XT 120 MG 24 hr capsule Take 120 mg by mouth daily.   0  . Cholecalciferol (VITAMIN D3) 2000 units TABS Take 2,000 Units  by mouth daily.     . diclofenac sodium (VOLTAREN) 1 % GEL Apply topically 4 (four) times daily.    . digoxin (LANOXIN) 0.125 MG tablet TAKE ONE TABLET BY MOUTH DAILY 90 tablet 3  . ENTRESTO 49-51 MG TAKE ONE TABLET BY MOUTH TWICE A DAY 60 tablet 0  . Magnesium 250 MG TABS Take 250 mg by mouth daily.     . Omega-3 Fatty Acids (FISH OIL) 1200 MG CAPS Take 1,200 mg by mouth daily.     . ondansetron (ZOFRAN) 4 MG tablet Take 1 tablet (4 mg total) by mouth every 8 (eight) hours as needed for nausea or vomiting. 10 tablet 0  . oxyCODONE (ROXICODONE) 5 MG immediate release tablet Take 1 tablet (5 mg total) by mouth every 4 (four) hours as needed for severe pain. 30 tablet 0   . Potassium Gluconate 550 (90 K) MG TABS Take 550 mg by mouth every other day.     . rivaroxaban (XARELTO) 20 MG TABS tablet Take 1 tablet (20 mg total) by mouth daily with supper. 90 tablet 0  . sennosides-docusate sodium (SENOKOT-S) 8.6-50 MG tablet Take 2 tablets by mouth daily. 30 tablet 1   No current facility-administered medications for this visit.    Allergies:   Patient has no known allergies.   ROS:  Please see the history of present illness.   Otherwise, review of systems are positive for none.   All other systems are reviewed and negative.    PHYSICAL EXAM: VS:  BP 122/78   Pulse 80   Ht 6\' 2"  (1.88 m)   Wt 185 lb 6.4 oz (84.1 kg)   SpO2 95%   BMI 23.80 kg/m  , BMI Body mass index is 23.8 kg/m. GENERAL:  Well appearing NECK:  No jugular venous distention, waveform within normal limits, carotid upstroke brisk and symmetric, no bruits, no thyromegaly LUNGS:  Clear to auscultation bilaterally CHEST:  Unremarkable HEART:  PMI not displaced or sustained,S1 and S2 within normal limits, no S3, no clicks, no rubs, no murmurs, irregular ABD:  Flat, positive bowel sounds normal in frequency in pitch, no bruits, no rebound, no guarding, no midline pulsatile mass, no hepatomegaly, no splenomegaly EXT:  2 plus pulses throughout, no edema, no cyanosis no clubbing   EKG:  EKG is not ordered today.    Recent Labs: 02/26/2019: BUN 21; Creatinine, Ser 0.99; Potassium 4.6; Sodium 135 02/27/2019: Hemoglobin 12.5; Platelets 190    Lipid Panel No results found for: CHOL, TRIG, HDL, CHOLHDL, VLDL, LDLCALC, LDLDIRECT    Wt Readings from Last 3 Encounters:  01/09/20 185 lb 6.4 oz (84.1 kg)  09/23/19 196 lb (88.9 kg)  02/25/19 194 lb (88 kg)      Other studies Reviewed: Additional studies/ records that were reviewed today include: None Review of the above records demonstrates:  Please see elsewhere in the note.     ASSESSMENT AND PLAN:  Atrial Fibrillation:    He tolerates  anticoagulation.  He does not notice his fibrillation.  He has good rate control.  No change in therapy.  Edema:   His edema is resolved.  He has a foot exam on the left which was the one bothering him is  Hypertension: Blood pressures is at target.  No change in therapy.  Cardiomyopathy: Ejection fraction is well-preserved.  No change in therapy.  I would not suspect clinically this is different from 2019 and I do not suspect overt heart failure.  No change  in therapy.   Covid education: He has had his vaccines.    Current medicines are reviewed at length with the patient today.  The patient does not have concerns regarding medicines.  The following changes have been made:  no change  Labs/ tests ordered today include: None No orders of the defined types were placed in this encounter.    Disposition:   FU with me in 12 MONTHS.    Signed, Minus Breeding, MD  01/09/2020 4:34 PM    [facility]

## 2020-01-09 ENCOUNTER — Other Ambulatory Visit: Payer: Self-pay

## 2020-01-09 ENCOUNTER — Encounter: Payer: Self-pay | Admitting: Cardiology

## 2020-01-09 ENCOUNTER — Ambulatory Visit: Payer: Medicare Other | Admitting: Cardiology

## 2020-01-09 VITALS — BP 122/78 | HR 80 | Ht 74.0 in | Wt 185.4 lb

## 2020-01-09 DIAGNOSIS — I10 Essential (primary) hypertension: Secondary | ICD-10-CM | POA: Diagnosis not present

## 2020-01-09 DIAGNOSIS — I420 Dilated cardiomyopathy: Secondary | ICD-10-CM | POA: Diagnosis not present

## 2020-01-09 DIAGNOSIS — R079 Chest pain, unspecified: Secondary | ICD-10-CM | POA: Diagnosis not present

## 2020-01-09 DIAGNOSIS — I4819 Other persistent atrial fibrillation: Secondary | ICD-10-CM | POA: Diagnosis not present

## 2020-01-09 NOTE — Patient Instructions (Signed)

## 2020-01-30 ENCOUNTER — Other Ambulatory Visit: Payer: Self-pay | Admitting: Cardiology

## 2020-02-06 DIAGNOSIS — I4891 Unspecified atrial fibrillation: Secondary | ICD-10-CM | POA: Diagnosis not present

## 2020-02-06 DIAGNOSIS — I10 Essential (primary) hypertension: Secondary | ICD-10-CM | POA: Diagnosis not present

## 2020-02-06 DIAGNOSIS — M179 Osteoarthritis of knee, unspecified: Secondary | ICD-10-CM | POA: Diagnosis not present

## 2020-02-06 DIAGNOSIS — E785 Hyperlipidemia, unspecified: Secondary | ICD-10-CM | POA: Diagnosis not present

## 2020-04-13 ENCOUNTER — Other Ambulatory Visit: Payer: Self-pay | Admitting: Cardiology

## 2020-04-13 MED ORDER — RIVAROXABAN 20 MG PO TABS: 20.0000 mg | ORAL_TABLET | Freq: Every day | ORAL | 0 refills | Status: DC

## 2020-04-13 NOTE — Telephone Encounter (Signed)
*  STAT* If patient is at the pharmacy, call can be transferred to refill team.   1. Which medications need to be refilled? (please list name of each medication and dose if known)? rivaroxaban (XARELTO) 20 MG TABS tablet  2. Which pharmacy/location (including street and city if local pharmacy) is medication to be sent to? [place], [place]  3. Do they need a 30 day or 90 day supply? 90 day    Patient states he is out of this medication. He was not able to take his dose this morning.

## 2020-04-15 DIAGNOSIS — R351 Nocturia: Secondary | ICD-10-CM | POA: Diagnosis not present

## 2020-04-15 DIAGNOSIS — C775 Secondary and unspecified malignant neoplasm of intrapelvic lymph nodes: Secondary | ICD-10-CM | POA: Diagnosis not present

## 2020-04-15 DIAGNOSIS — C61 Malignant neoplasm of prostate: Secondary | ICD-10-CM | POA: Diagnosis not present

## 2020-04-20 DIAGNOSIS — M179 Osteoarthritis of knee, unspecified: Secondary | ICD-10-CM | POA: Diagnosis not present

## 2020-04-20 DIAGNOSIS — E785 Hyperlipidemia, unspecified: Secondary | ICD-10-CM | POA: Diagnosis not present

## 2020-04-20 DIAGNOSIS — I10 Essential (primary) hypertension: Secondary | ICD-10-CM | POA: Diagnosis not present

## 2020-04-20 DIAGNOSIS — I4891 Unspecified atrial fibrillation: Secondary | ICD-10-CM | POA: Diagnosis not present

## 2020-05-10 DIAGNOSIS — Z23 Encounter for immunization: Secondary | ICD-10-CM | POA: Diagnosis not present

## 2020-05-10 DIAGNOSIS — K4090 Unilateral inguinal hernia, without obstruction or gangrene, not specified as recurrent: Secondary | ICD-10-CM | POA: Diagnosis not present

## 2020-05-12 ENCOUNTER — Telehealth: Payer: Self-pay | Admitting: Cardiology

## 2020-05-12 NOTE — Telephone Encounter (Signed)
I spoke with the retail [facility] pharmacy and he has some Xarelto on hold that they can get ready for him. I let Mrs Vassar know and that we are still waiting on the in office pharmacist to look at the [place] issue and see if there is a solution.

## 2020-05-12 NOTE — Telephone Encounter (Signed)
Pt c/o medication issue:  1. Name of Medication: bisoprolol-hydrochlorothiazide (ZIAC) 10-6.25 MG tablet / digoxin (LANOXIN) 0.125 MG tablet / ENTRESTO 49-51 MG /   rivaroxaban (XARELTO) 20 MG TABS tablet   2. How are you currently taking this medication (dosage and times per day)? As directed  3. Are you having a reaction (difficulty breathing--STAT)? no  4. What is your medication issue? Patient's wife states that she got a call from their pharmacy that states all of the medications prescribed by Dr. Percival Spanish have no more refills. She would like to know if that is true. I listed all the one's I could find that Dr. Percival Spanish prescribes.

## 2020-05-12 NOTE — Telephone Encounter (Signed)
I reviewed Ronnie Booth's medication.  He has refills on Lanoxin and Entresto. I will speak to the retail pharmacy and find out why he was only given #30 Xarelto instead of the ordered disp of #90.  The biggest problem is the Ziac is so small and not scored, that she has tried multiple pill cutters and still the pills end up being crushed. So much of the Rx is wasted. Pharmacy,  is there a solution for this or perhaps an alternative medication that can be changed to avoid the crushed pills?

## 2020-05-13 MED ORDER — BISOPROLOL FUMARATE 5 MG PO TABS: 5.0000 mg | ORAL_TABLET | Freq: Every day | ORAL | 11 refills | Status: AC

## 2020-05-13 NOTE — Telephone Encounter (Signed)
Pt's wife aware of new orders and agrees as well as verbalizes understanding .Adonis Housekeeper

## 2020-05-13 NOTE — Telephone Encounter (Signed)
We can divide the Ziac 10/6.25 into each part.  We can give bisoprolol 5 mg tablets, but we can't get 3.125 mg of hctz.  Can we just discontinue that part? It's not enough to be much/any help.

## 2020-05-13 NOTE — Telephone Encounter (Signed)
Yes we can stop the HCTZ

## 2020-05-13 NOTE — Telephone Encounter (Signed)
Let's switch him to bisoprolol 5 mg qd and don't worry about that very small amount of hctz.

## 2020-05-20 ENCOUNTER — Ambulatory Visit: Payer: Self-pay | Admitting: General Surgery

## 2020-05-20 DIAGNOSIS — K4090 Unilateral inguinal hernia, without obstruction or gangrene, not specified as recurrent: Secondary | ICD-10-CM | POA: Diagnosis not present

## 2020-05-20 NOTE — H&P (Signed)
History of Present Illness Ralene Ok MD; 05/20/2020 3:17 PM) The patient is a 74 year old male who presents with an inguinal hernia. Chief Complaint: Right inguinal hernia  Patient is a 74 year old male who comes in secondary to a pain and bulge in the right inguinal area. Patient has a history of A. fib, on Xarelto, hypertension, has had multiple abdominal surgeries. Patient states that he previously noticed a pain in the right inguinal area less to 3 months. He states that recently while lifting rotors he had noticeable as the right inguinal area. He states that since that time is been minimizing his lifting. He states that the bulge does reduce on its own.  Patient has a lower midline incision for prostatectomy, McBurney's incision as well as right paramedian incision for appendectomy and internal hemorrhage postop.     Past Surgical History (Chanel Teressa Senter, [place]; 05/20/2020 3:00 PM) Appendectomy  Cataract Surgery  Right. Colon Polyp Removal - Colonoscopy  Knee Surgery  Bilateral. Open Inguinal Hernia Surgery  Left. Oral Surgery  Prostate Surgery - Removal  Shoulder Surgery  Right.  Diagnostic Studies History (Chanel Teressa Senter, CMA; 05/20/2020 3:00 PM) Colonoscopy  1-5 years ago  Allergies (Chanel Teressa Senter, CMA; 05/20/2020 3:01 PM) No Known Drug Allergies  [05/20/2020]: Allergies Reconciled   Medication History (Chanel Teressa Senter, CMA; 05/20/2020 3:01 PM) Bisoprolol Fumarate (5MG  Tablet, Oral) Active. Cartia XT (120MG  Capsule ER 24HR, Oral) Active. Digoxin (125MCG Tablet, Oral) Active. Potassium Gluconate (550MG  Tablet, Oral) Active. Xarelto (20MG  Tablet, Oral) Active. Medications Reconciled  Social History Antonietta Jewel, CMA; 05/20/2020 3:00 PM) Alcohol use  Occasional alcohol use. Caffeine use  Coffee, Tea. No drug use  Tobacco use  Never smoker.  Family History ([place], [place]; 05/20/2020 3:00 PM) Arthritis  Mother.  Other Problems (Chanel Teressa Senter,  CMA; 05/20/2020 3:00 PM) Atrial Fibrillation  Enlarged Prostate  High blood pressure  Hypercholesterolemia  Inguinal Hernia  Kidney Stone  Prostate Cancer     Review of Systems Ralene Ok MD; 05/20/2020 3:15 PM) General Not Present- Appetite Loss, Chills, Fatigue, Fever, Night Sweats, Weight Gain and Weight Loss. Skin Not Present- Change in Wart/Mole, Dryness, Hives, Jaundice, New Lesions, Non-Healing Wounds, Rash and Ulcer. HEENT Present- Wears glasses/contact lenses. Not Present- Earache, Hearing Loss, Hoarseness, Nose Bleed, Oral Ulcers, Ringing in the Ears, Seasonal Allergies, Sinus Pain, Sore Throat, Visual Disturbances and Yellow Eyes. Respiratory Not Present- Bloody sputum, Chronic Cough, Difficulty Breathing, Snoring and Wheezing. Breast Not Present- Breast Mass, Breast Pain, Nipple Discharge and Skin Changes. Cardiovascular Present- Leg Cramps and Shortness of Breath. Not Present- Chest Pain, Difficulty Breathing Lying Down, Palpitations, Rapid Heart Rate and Swelling of Extremities. Gastrointestinal Not Present- Abdominal Pain, Bloating, Bloody Stool, Change in Bowel Habits, Chronic diarrhea, Constipation, Difficulty Swallowing, Excessive gas, Gets full quickly at meals, Hemorrhoids, Indigestion, Nausea, Rectal Pain and Vomiting. Male Genitourinary Present- Frequency. Not Present- Blood in Urine, Change in Urinary Stream, Impotence, Nocturia, Painful Urination, Urgency and Urine Leakage. Musculoskeletal Present- Muscle Weakness. Not Present- Back Pain, Joint Pain, Joint Stiffness, Muscle Pain and Swelling of Extremities. Neurological Not Present- Decreased Memory, Fainting, Headaches, Numbness, Seizures, Tingling, Tremor, Trouble walking and Weakness. Psychiatric Not Present- Anxiety, Bipolar, Change in Sleep Pattern, Depression, Fearful and Frequent crying. Endocrine Present- Hot flashes. Not Present- Cold Intolerance, Excessive Hunger, Hair Changes, Heat Intolerance and  New Diabetes. Hematology Not Present- Blood Thinners, Easy Bruising, Excessive bleeding, Gland problems, HIV and Persistent Infections. All other systems negative  Vitals (Chanel Nolan CMA; 05/20/2020 3:02 PM) 05/20/2020 3:02 PM  Weight: 192.25 lb Height: 74in Body Surface Area: 2.14 m Body Mass Index: 24.68 kg/m  Temp.: 97.72F  Pulse: 93 (Regular)  BP: 130/84(Sitting, Left Arm, Standard)       Physical Exam Ralene Ok MD; 05/20/2020 3:17 PM) The physical exam findings are as follows: Note: Constitutional: No acute distress, conversant, appears stated age  Eyes: Anicteric sclerae, moist conjunctiva, no lid lag  Neck: No thyromegaly, trachea midline, no cervical lymphadenopathy  Lungs: Clear to auscultation biilaterally, normal respiratory effot  Cardiovascular: regular rate & rhythm, no murmurs, no peripheal edema, pedal pulses 2+  GI: Soft, no masses or hepatosplenomegaly, non-tender to palpation  MSK: Normal gait, no clubbing cyanosis, edema  Skin: No rashes, palpation reveals normal skin turgor  Psychiatric: Appropriate judgment and insight, oriented to person, place, and time  Abdomen Inspection Hernias - Right - Inguinal hernia - Reducible - Right.    Assessment & Plan Ralene Ok MD; 05/20/2020 3:18 PM) RIGHT INGUINAL HERNIA (K40.90) Impression: 74 year old male with a right inguinal hernia  Patient on also, with a history of A. fib. We'll get clearance by Dr. Sharen Hint his cardiologist. 1. The patient will like to proceed to the operating room for open right inguinal hernia repair with mesh.  2. I discussed with the patient the signs and symptoms of incarceration and strangulation and the need to proceed to the ER should they occur.  3. I discussed with the patient the risks and benefits of the procedure to include but not limited to: Infection, bleeding, damage to surrounding structures, possible need for further surgery, possible nerve  pain, and possible recurrence. The patient was understanding and wishes to proceed.

## 2020-05-21 ENCOUNTER — Telehealth: Payer: Self-pay

## 2020-05-21 NOTE — Telephone Encounter (Signed)
   [facility] Pre-operative Risk Assessment    Request for surgical clearance:  1. What type of surgery is being performed? OPEN RIGHT INGUINAL HERNIA REPAIR W/MESH   2. When is this surgery scheduled? TBD   3. What type of clearance is required (medical clearance vs. Pharmacy clearance to hold med vs. Both)? BOTH  4. Are there any medications that need to be held prior to surgery and how long?  XARELTO 20MG  5. Practice name and name of physician performing surgery? [facility] ATTN:DIANE   6. What is the office phone number? [phone]   7.   What is the office fax number? [phone]  8.   Anesthesia type (None, local, MAC, general) ? GENERAL

## 2020-05-21 NOTE — Telephone Encounter (Signed)
Patient with diagnosis of afib on Xarelto for anticoagulation.    Procedure: OPEN RIGHT INGUINAL HERNIA REPAIR W/MESH  Date of procedure: TBD  CHADS2-VASc score of 4 (age x2, CHF, HTN)  CrCl 53mL/min Platelet count 190K  Per office protocol, patient can hold Xarelto for 2 days prior to procedure.

## 2020-05-21 NOTE — Telephone Encounter (Signed)
   Primary Cardiologist: Minus Breeding, MD  Chart reviewed as part of pre-operative protocol coverage. Given past medical history and time since last visit, based on ACC/AHA guidelines, ORLYN ODONOGHUE would be at acceptable risk for the planned procedure without further cardiovascular testing.   Patient with diagnosis of afib on Xarelto for anticoagulation.    Procedure: OPEN RIGHT INGUINAL HERNIA REPAIR W/MESH Date of procedure: TBD  CHADS2-VASc score of 4 (age x2, CHF, HTN)  CrCl 63mL/min Platelet count 190K  Per office protocol, patient can hold Xarelto for 2 days prior to procedure.    Patient was advised that if he develops new symptoms prior to surgery to contact our office to arrange a follow-up appointment.  He verbalized understanding.  I will route this recommendation to the requesting party via Epic fax function and remove from pre-op pool.  Please call with questions.  Jossie Ng. Hugh Kamara NP-C    05/21/2020, 1:06 PM [facility] [address] Office [phone] Fax [phone]

## 2020-06-25 NOTE — Progress Notes (Signed)
[place], [place] [address] Phone: [phone] Fax: [phone]      Your procedure is scheduled on 07/01/20.  Report to [facility] Main Entrance "A" at 9:00 A.M., and check in at the Admitting office.  Call this number if you have problems the morning of surgery:  [phone]  Call [phone] if you have any questions prior to your surgery date Monday-Friday 8am-4pm    Remember:  Do not eat after midnight the night before your surgery  You may drink clear liquids until 8:00 the morning of your surgery.   Clear liquids allowed are: Water, Non-Citrus Juices (without pulp), Carbonated Beverages, Clear Tea, Black Coffee Only, and Gatorade  Please complete your PRE-SURGERY ENSURE that was provided to you by .8:00 the morning of surgery.  Please, if able, drink it in one setting. DO NOT SIP.    Take these medicines the morning of surgery with A SIP OF WATER: bisoprolol (ZEBETA) CARTIA  digoxin (LANOXIN)  Follow your surgeon's instructions on when to stop Xarelto.  If no instructions were given by your surgeon then you will need to call the office to get those instructions.    As of today, STOP taking any Aspirin (unless otherwise instructed by your surgeon) Aleve, Naproxen, Ibuprofen, Motrin, Advil, Goody's, BC's, all herbal medications, fish oil, and all vitamins.                      Do not wear jewelry            Do not wear lotions, powders, colognes, or deodorant.            Do not shave 48 hours prior to surgery.  Men may shave face and neck.            Do not bring valuables to the hospital.            [facility] is not responsible for any belongings or valuables.  Do NOT Smoke (Tobacco/Vaping) or drink Alcohol 24 hours prior to your procedure If you use a CPAP at night, you may bring all equipment for your overnight stay.   Contacts, glasses, dentures or bridgework may not be  worn into surgery.      For patients admitted to the hospital, discharge time will be determined by your treatment team.   Patients discharged the day of surgery will not be allowed to drive home, and someone needs to stay with them for 24 hours.    Special instructions:   [facility]- Preparing For Surgery  Before surgery, you can play an important role. Because skin is not sterile, your skin needs to be as free of germs as possible. You can reduce the number of germs on your skin by washing with CHG (chlorahexidine gluconate) Soap before surgery.  CHG is an antiseptic cleaner which kills germs and bonds with the skin to continue killing germs even after washing.    Oral Hygiene is also important to reduce your risk of infection.  Remember - BRUSH YOUR TEETH THE MORNING OF SURGERY WITH YOUR REGULAR TOOTHPASTE  Please do not use if you have an allergy to CHG or antibacterial soaps. If your skin becomes reddened/irritated stop using the CHG.  Do not shave (including legs and underarms) for at least 48 hours prior to first CHG shower. It is OK to shave your face.  Please follow these instructions carefully.  1. Shower the NIGHT BEFORE SURGERY and the MORNING OF SURGERY with CHG Soap.   2. If you chose to wash your hair, wash your hair first as usual with your normal shampoo.  3. After you shampoo, rinse your hair and body thoroughly to remove the shampoo.  4. Use CHG as you would any other liquid soap. You can apply CHG directly to the skin and wash gently with a scrungie or a clean washcloth.   5. Apply the CHG Soap to your body ONLY FROM THE NECK DOWN.  Do not use on open wounds or open sores. Avoid contact with your eyes, ears, mouth and genitals (private parts). Wash Face and genitals (private parts)  with your normal soap.   6. Wash thoroughly, paying special attention to the area where your surgery will be performed.  7. Thoroughly rinse your body with warm water from the neck  down.  8. DO NOT shower/wash with your normal soap after using and rinsing off the CHG Soap.  9. Pat yourself dry with a CLEAN TOWEL.  10. Wear CLEAN PAJAMAS to bed the night before surgery  11. Place CLEAN SHEETS on your bed the night of your first shower and DO NOT SLEEP WITH PETS.   Day of Surgery: Wear Clean/Comfortable clothing the morning of surgery Do not apply any deodorants/lotions.   Remember to brush your teeth WITH YOUR REGULAR TOOTHPASTE.   Please read over the following fact sheets that you were given.

## 2020-06-28 ENCOUNTER — Other Ambulatory Visit: Payer: Self-pay

## 2020-06-28 ENCOUNTER — Other Ambulatory Visit (HOSPITAL_COMMUNITY)
Admission: RE | Admit: 2020-06-28 | Discharge: 2020-06-28 | Disposition: A | Discharge status: Home / Self Care | Payer: Medicare Other | Source: Ambulatory Visit | Attending: General Surgery | Admitting: General Surgery

## 2020-06-28 ENCOUNTER — Encounter (HOSPITAL_COMMUNITY)
Admission: RE | Admit: 2020-06-28 | Discharge: 2020-06-28 | Disposition: A | Discharge status: Home / Self Care | Payer: Medicare Other | Source: Ambulatory Visit | Attending: General Surgery | Admitting: General Surgery

## 2020-06-28 ENCOUNTER — Encounter (HOSPITAL_COMMUNITY): Payer: Self-pay

## 2020-06-28 DIAGNOSIS — K4090 Unilateral inguinal hernia, without obstruction or gangrene, not specified as recurrent: Secondary | ICD-10-CM | POA: Insufficient documentation

## 2020-06-28 DIAGNOSIS — I4891 Unspecified atrial fibrillation: Secondary | ICD-10-CM | POA: Insufficient documentation

## 2020-06-28 DIAGNOSIS — E785 Hyperlipidemia, unspecified: Secondary | ICD-10-CM | POA: Insufficient documentation

## 2020-06-28 DIAGNOSIS — Z8546 Personal history of malignant neoplasm of prostate: Secondary | ICD-10-CM | POA: Insufficient documentation

## 2020-06-28 DIAGNOSIS — I429 Cardiomyopathy, unspecified: Secondary | ICD-10-CM | POA: Insufficient documentation

## 2020-06-28 DIAGNOSIS — Z01812 Encounter for preprocedural laboratory examination: Secondary | ICD-10-CM | POA: Insufficient documentation

## 2020-06-28 DIAGNOSIS — R011 Cardiac murmur, unspecified: Secondary | ICD-10-CM | POA: Insufficient documentation

## 2020-06-28 DIAGNOSIS — Z7901 Long term (current) use of anticoagulants: Secondary | ICD-10-CM | POA: Insufficient documentation

## 2020-06-28 DIAGNOSIS — Z79899 Other long term (current) drug therapy: Secondary | ICD-10-CM | POA: Insufficient documentation

## 2020-06-28 DIAGNOSIS — Z96651 Presence of right artificial knee joint: Secondary | ICD-10-CM | POA: Insufficient documentation

## 2020-06-28 DIAGNOSIS — I10 Essential (primary) hypertension: Secondary | ICD-10-CM | POA: Insufficient documentation

## 2020-06-28 DIAGNOSIS — Z20822 Contact with and (suspected) exposure to covid-19: Secondary | ICD-10-CM | POA: Diagnosis not present

## 2020-06-28 HISTORY — DX: Unilateral inguinal hernia, without obstruction or gangrene, not specified as recurrent: K40.90

## 2020-06-28 HISTORY — DX: Cardiac arrhythmia, unspecified: I49.9

## 2020-06-28 LAB — BASIC METABOLIC PANEL
Anion gap: 9 (ref 5–15)
BUN: 18 mg/dL (ref 8–23)
CO2: 27 mmol/L (ref 22–32)
Calcium: 9.5 mg/dL (ref 8.9–10.3)
Chloride: 105 mmol/L (ref 98–111)
Creatinine, Ser: 0.87 mg/dL (ref 0.61–1.24)
GFR, Estimated: >60 mL/min (ref >60)
Glucose, Bld: 91 mg/dL (ref 70–99)
Potassium: 4.2 mmol/L (ref 3.5–5.1)
Sodium: 141 mmol/L (ref 135–145)

## 2020-06-28 LAB — CBC
HCT: 47.8 % (ref 39.0–52.0)
Hemoglobin: 15.3 g/dL (ref 13.0–17.0)
MCH: 29.8 pg (ref 26.0–34.0)
MCHC: 32 g/dL (ref 30.0–36.0)
MCV: 93.2 fL (ref 80.0–100.0)
Platelets: 229 10*3/uL (ref 150–400)
RBC: 5.13 MIL/uL (ref 4.22–5.81)
RDW: 13.1 % (ref 11.5–15.5)
WBC: 8 10*3/uL (ref 4.0–10.5)
nRBC: 0 % (ref 0.0–0.2)

## 2020-06-28 LAB — SARS CORONAVIRUS 2 (TAT 6-24 HRS): SARS Coronavirus 2: NEGATIVE

## 2020-06-28 NOTE — Progress Notes (Addendum)
PCP - Dr. Orpah Melter Cardiologist - Dr. Percival Spanish  Chest x-ray - Not indicated EKG - 09/23/19 Stress Test - 03/30/17  ECHO - 02/16/18 Cardiac Cath - Denies  Sleep Study - DEnies OSA  DM - Denies  Blood Thinner Instructions:Xarelto Last dose 06/28/20  ERAS Protcol -Yes  PRE-SURGERY Ensure given   COVID TEST- 06/28/20  Anesthesia review: Yes cardiac h/o of afib  Patient denies shortness of breath, fever, cough and chest pain at PAT appointment   All instructions explained to the patient, with a verbal understanding of the material. Patient agrees to go over the instructions while at home for a better understanding. Patient also instructed to self quarantine after being tested for COVID-19. The opportunity to ask questions was provided.

## 2020-06-29 ENCOUNTER — Other Ambulatory Visit: Payer: Self-pay | Admitting: Cardiology

## 2020-06-29 NOTE — Progress Notes (Signed)
Anesthesia Chart Review:  Case: [id number] Date/Time: 07/01/20 1045   Procedure: OPEN RIGHT INGUINAL HERNIA REPAIR WITH MESH (Right )   Anesthesia type: General   Pre-op diagnosis: RGHT INGUINAL HERNIA   Location: [facility] OR ROOM 09 / [facility]   Surgeons: Ralene Ok, MD      DISCUSSION: Patient is a 74 year old male scheduled for the above procedure.   History includes non-smoker, HTN, atrial fibrillation (s/p cardioversion 03/12/17), cardiomyopathy (normal stress test 2018; EF 35-40% 03/09/17, 55-60%  EF6/24/19), murmur (mild MR/AR 2019), dyspnea (2018), dyslipidemia, prostate cancer (s/p surgery 2002), right TKA (02/26/19)  Preoperative cardiology input outlined on 05/21/20 by Coletta Memos, NP: "Given past medical history and time since last visit, based on ACC/AHA guidelines, Ronnie Booth would be at acceptable risk for the planned procedure without further cardiovascular testing.Marland KitchenMarland KitchenPer office protocol, patient can holdXareltofor 2days prior to procedure." Last Xarelto 06/28/20.   06/28/2020 presurgical COVID-19 test negative.  Anesthesia team to evaluate on the day of surgery.   VS: BP (!) 149/98   Pulse 69   Temp 36.6 C (Oral)   Resp 20   Ht 6\' 2"  (1.88 m)   Wt 86.9 kg   SpO2 97%   BMI 24.60 kg/m     PROVIDERS: Orpah Melter, MD is PCP Minus Breeding, MD is cardiologist. Last visit 01/09/20.   LABS: Labs reviewed: Acceptable for surgery. (all labs ordered are listed, but only abnormal results are displayed)  Labs Reviewed  BASIC METABOLIC PANEL  CBC     IMAGES: PET Scan 11/05/18 (ordered by urologist Wendie Simmer, MD for bicochemical recurrence of prostate carcinoma): IMPRESSION: 1. Intense radiotracer activity associated with RIGHT iliac lymph nodes consistent with metastatic adenopathy. 2. 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 The highest lymph node is just below the renal veins. 3. No evidence of liver  metastasis. 4. No skeletal metastasis.   EKG: 09/23/2019" Atrial fibrillation at 69 bpm Nonspecific ST abnormality   CV: Echo 02/11/2018 Study Conclusions  - Left ventricle: The cavity size was normal. Wall thickness was increased in a pattern of mild LVH. Systolic function was normal. The estimated ejection fraction was in the range of 55% to 60%. There is hypokinesis of the mid-apicalanteroseptal myocardium. There was no evidence of elevated ventricular filling pressure by Doppler parameters. - Aortic valve: There was mild regurgitation. - Mitral valve: There was mild regurgitation. - Left atrium: The atrium was severely dilated. Volume/bsa, S: 67.2 ml/m^2. Volume/bsa, ES (1-plane Simpson&'s, A4C): 51.9 ml/m^2. - Right atrium: The atrium was mildly dilated.  Impressions:  - EF is improved as compared to prior ECHO (35%)   48 hour Holter Monitor 03/27/17  Study Highlights Atrial fib with reasonable rate control.   Rare ventricular ectopy.    Stress Test 03/30/17  There was no ST segment deviation noted during stress.  The study is normal.  This is a low risk study.  Normal pharmacologic nuclear stress test with no evidence for prior infarct or ischemia.   Past Medical History:  Diagnosis Date  . AR (aortic regurgitation) 02/11/2018   Mild, Noted on ECHO  . Arthritis   . Atrial fibrillation by electrocardiogram ([place]) 10/14/2018  . Cardiomyopathy ([facility])   . Dyslipidemia   . Dyspnea 02/2017   in A. Fib  . Dysrhythmia   . Heart murmur    History of murmur in high school, undetected at this time  . History of kidney stones   .  Hypertension   . Inguinal hernia 1980's   Left  . LAE (left atrial enlargement) 02/11/2018   Severly dilated, Noted on ECHO  . LVH (left ventricular hypertrophy) 02/11/2018   Mild, Noted on ECHO  . Medical history reviewed with no changes   . MR (mitral regurgitation) 02/11/2018   Mild, Noted on ECHO  . Primary  localized osteoarthritis of right knee 02/25/2019  . Prostate cancer [facility])     Past Surgical History:  Procedure Laterality Date  . APPENDECTOMY     had internal bleeding and had to go back to surgery  . CARDIOVERSION N/A 03/12/2017   Procedure: CARDIOVERSION;  Surgeon: Sanda Klein, MD;  Location: [facility] END[facility];  Service: Cardiovascular;  Laterality: N/A;  . COLONOSCOPY    . INGUINAL HERNIA REPAIR Left   . JOINT REPLACEMENT Left    knee  . LITHOTRIPSY    . PROSTATE SURGERY  2002  . REVERSE SHOULDER ARTHROPLASTY Right 01/13/2016   Procedure: RIGHT REVERSE SHOULDER ARTHROPLASTY;  Surgeon: Justice Britain, MD;  Location: [facility];  Service: Orthopedics;  Laterality: Right;  . TOTAL KNEE ARTHROPLASTY Right 02/25/2019   Procedure: TOTAL KNEE ARTHROPLASTY AND HARDWARE REMOVAL;  Surgeon: [name], MD;  Location: [facility] ORS;  Service: Orthopedics;  Laterality: Right;    MEDICATIONS: . B Complex-C (SUPER B COMPLEX PO)  . baclofen (LIORESAL) 10 MG tablet  . bisoprolol (ZEBETA) 5 MG tablet  . CARTIA XT 120 MG 24 hr capsule  . Cholecalciferol (VITAMIN D3) 2000 units TABS  . diclofenac sodium (VOLTAREN) 1 % GEL  . digoxin (LANOXIN) 0.125 MG tablet  . ENTRESTO 49-51 MG  . Magnesium 250 MG TABS  . Omega-3 Fatty Acids (FISH OIL) 1200 MG CAPS  . ondansetron (ZOFRAN) 4 MG tablet  . oxyCODONE (ROXICODONE) 5 MG immediate release tablet  . Potassium Gluconate 550 (90 K) MG TABS  . rivaroxaban (XARELTO) 20 MG TABS tablet  . sennosides-docusate sodium (SENOKOT-S) 8.6-50 MG tablet   No current facility-administered medications for this encounter.    Myra Gianotti, PA-C Surgical Short Stay/Anesthesiology [facility] Phone [phone] [facility] Phone [phone] 06/29/2020 1:26 PM

## 2020-06-29 NOTE — Anesthesia Preprocedure Evaluation (Addendum)
Anesthesia Evaluation  Patient identified by MRN, date of birth, ID band Patient awake    Reviewed: Allergy & Precautions, NPO status , Patient's Chart, lab work & pertinent test results  Airway Mallampati: II  TM Distance: >3 FB Neck ROM: Full    Dental no notable dental hx.    Pulmonary shortness of breath and with exertion,    Pulmonary exam normal breath sounds clear to auscultation       Cardiovascular hypertension, Pt. on home beta blockers +CHF  Normal cardiovascular exam+ dysrhythmias Atrial Fibrillation + Valvular Problems/Murmurs AI  Rhythm:Regular Rate:Normal  ECG: a-fib, rate 69  ECHO: Left ventricle: The cavity size was normal. Wall thickness was increased in a pattern of mild LVH. Systolic function was normal.  The estimated ejection fraction was in the range of 55% to 60%.  There is hypokinesis of the mid-apicalanteroseptal myocardium.  There was no evidence of elevated ventricular filling pressure by Doppler parameters.  Aortic valve: There was mild regurgitation.  Mitral valve: There was mild regurgitation.  Left atrium: The atrium was severely dilated. Volume/bsa, S: 67.2 ml/m^2. Volume/bsa, ES (1-plane Simpson&'s, A4C): 51.9 ml/m^2.  Right atrium: The atrium was mildly dilated.  Impressions:  EF is improved as compared to prior ECHO (35%)   Neuro/Psych negative neurological ROS  negative psych ROS   GI/Hepatic negative GI ROS, Neg liver ROS,   Endo/Other  negative endocrine ROS  Renal/GU negative Renal ROS     Musculoskeletal  (+) Arthritis ,   Abdominal   Peds  Hematology negative hematology ROS (+)   Anesthesia Other Findings RGHT INGUINAL HERNIA  Reproductive/Obstetrics                          Anesthesia Physical Anesthesia Plan  ASA: III  Anesthesia Plan: General and Regional   Post-op Pain Management: GA combined w/ Regional for post-op pain   Induction:  Intravenous  PONV Risk Score and Plan: 3 and Ondansetron, Dexamethasone and Treatment may vary due to age or medical condition  Airway Management Planned: Oral ETT  Additional Equipment:   Intra-op Plan:   Post-operative Plan: Extubation in OR  Informed Consent: I have reviewed the patients History and Physical, chart, labs and discussed the procedure including the risks, benefits and alternatives for the proposed anesthesia with the patient or authorized representative who has indicated his/her understanding and acceptance.     Dental advisory given  Plan Discussed with: CRNA  Anesthesia Plan Comments: (Reviewed PAT note written 06/29/2020 by Myra Gianotti, PA-C. )     Anesthesia Quick Evaluation

## 2020-07-01 ENCOUNTER — Encounter (HOSPITAL_COMMUNITY)
Admission: RE | Disposition: A | Discharge status: Home / Self Care | Payer: Self-pay | Source: Ambulatory Visit | Attending: General Surgery

## 2020-07-01 ENCOUNTER — Ambulatory Visit (HOSPITAL_COMMUNITY): Payer: Medicare Other | Admitting: Anesthesiology

## 2020-07-01 ENCOUNTER — Ambulatory Visit (HOSPITAL_COMMUNITY): Payer: Medicare Other | Admitting: Vascular Surgery

## 2020-07-01 ENCOUNTER — Ambulatory Visit (HOSPITAL_COMMUNITY)
Admission: RE | Admit: 2020-07-01 | Discharge: 2020-07-01 | Disposition: A | Discharge status: Home / Self Care | Payer: Medicare Other | Source: Ambulatory Visit | Attending: General Surgery | Admitting: General Surgery

## 2020-07-01 DIAGNOSIS — K4090 Unilateral inguinal hernia, without obstruction or gangrene, not specified as recurrent: Secondary | ICD-10-CM | POA: Insufficient documentation

## 2020-07-01 DIAGNOSIS — I420 Dilated cardiomyopathy: Secondary | ICD-10-CM | POA: Diagnosis not present

## 2020-07-01 DIAGNOSIS — E785 Hyperlipidemia, unspecified: Secondary | ICD-10-CM | POA: Diagnosis not present

## 2020-07-01 DIAGNOSIS — Z8719 Personal history of other diseases of the digestive system: Secondary | ICD-10-CM

## 2020-07-01 DIAGNOSIS — G8918 Other acute postprocedural pain: Secondary | ICD-10-CM | POA: Diagnosis not present

## 2020-07-01 HISTORY — PX: INGUINAL HERNIA REPAIR: SHX194

## 2020-07-01 SURGERY — REPAIR, HERNIA, INGUINAL, ADULT
Anesthesia: Regional | Site: Inguinal | Laterality: Right

## 2020-07-01 MED ORDER — MIDAZOLAM HCL 2 MG/2ML IJ SOLN: 1.0000 mg | Freq: Once | INTRAMUSCULAR | Status: AC

## 2020-07-01 MED ORDER — PROPOFOL 10 MG/ML IV BOLUS
INTRAVENOUS | Status: DC | PRN
  Administered 2020-07-01 (×2): 100 mg via INTRAVENOUS

## 2020-07-01 MED ORDER — ROCURONIUM BROMIDE 10 MG/ML (PF) SYRINGE
PREFILLED_SYRINGE | INTRAVENOUS | Status: DC | PRN
  Administered 2020-07-01: 20 mg via INTRAVENOUS
  Administered 2020-07-01: 40 mg via INTRAVENOUS

## 2020-07-01 MED ORDER — CHLORHEXIDINE GLUCONATE CLOTH 2 % EX PADS: 6.0000 | MEDICATED_PAD | Freq: Once | CUTANEOUS | Status: DC

## 2020-07-01 MED ORDER — BUPIVACAINE HCL (PF) 0.25 % IJ SOLN
INTRAMUSCULAR | Status: AC
  Filled 2020-07-01: qty 30

## 2020-07-01 MED ORDER — MIDAZOLAM HCL 2 MG/2ML IJ SOLN
INTRAMUSCULAR | Status: AC
  Filled 2020-07-01: qty 2

## 2020-07-01 MED ORDER — ONDANSETRON HCL 4 MG/2ML IJ SOLN
INTRAMUSCULAR | Status: DC | PRN
  Administered 2020-07-01: 4 mg via INTRAVENOUS

## 2020-07-01 MED ORDER — FENTANYL CITRATE (PF) 100 MCG/2ML IJ SOLN: 50.0000 ug | Freq: Once | INTRAMUSCULAR | Status: AC

## 2020-07-01 MED ORDER — FENTANYL CITRATE (PF) 250 MCG/5ML IJ SOLN
INTRAMUSCULAR | Status: DC | PRN
  Administered 2020-07-01: 50 ug via INTRAVENOUS

## 2020-07-01 MED ORDER — PROPOFOL 10 MG/ML IV BOLUS
INTRAVENOUS | Status: AC
  Filled 2020-07-01: qty 20

## 2020-07-01 MED ORDER — TRAMADOL HCL 50 MG PO TABS
50.0000 mg | ORAL_TABLET | Freq: Four times a day (QID) | ORAL | 0 refills | Status: AC | PRN
Start: 2020-07-01 — End: 2021-07-01

## 2020-07-01 MED ORDER — FENTANYL CITRATE (PF) 100 MCG/2ML IJ SOLN: 25.0000 ug | INTRAMUSCULAR | Status: DC | PRN

## 2020-07-01 MED ORDER — CHLORHEXIDINE GLUCONATE 0.12 % MT SOLN
15.0000 mL | Freq: Once | OROMUCOSAL | Status: AC
  Administered 2020-07-01: 15 mL via OROMUCOSAL
  Filled 2020-07-01: qty 15

## 2020-07-01 MED ORDER — LACTATED RINGERS IV SOLN: INTRAVENOUS | Status: DC

## 2020-07-01 MED ORDER — LIDOCAINE 2% (20 MG/ML) 5 ML SYRINGE
INTRAMUSCULAR | Status: DC | PRN
  Administered 2020-07-01: 40 mg via INTRAVENOUS

## 2020-07-01 MED ORDER — PHENYLEPHRINE 40 MCG/ML (10ML) SYRINGE FOR IV PUSH (FOR BLOOD PRESSURE SUPPORT)
PREFILLED_SYRINGE | INTRAVENOUS | Status: DC | PRN
  Administered 2020-07-01: 80 ug via INTRAVENOUS
  Administered 2020-07-01: 120 ug via INTRAVENOUS
  Administered 2020-07-01: 80 ug via INTRAVENOUS

## 2020-07-01 MED ORDER — MIDAZOLAM HCL 2 MG/2ML IJ SOLN
INTRAMUSCULAR | Status: AC
  Administered 2020-07-01: 1 mg via INTRAVENOUS
  Filled 2020-07-01: qty 2

## 2020-07-01 MED ORDER — CEFAZOLIN SODIUM-DEXTROSE 2-4 GM/100ML-% IV SOLN
2.0000 g | INTRAVENOUS | Status: AC
  Administered 2020-07-01: 2 g via INTRAVENOUS
  Filled 2020-07-01: qty 100

## 2020-07-01 MED ORDER — ACETAMINOPHEN 500 MG PO TABS
1000.0000 mg | ORAL_TABLET | ORAL | Status: AC
  Administered 2020-07-01: 1000 mg via ORAL
  Filled 2020-07-01: qty 2

## 2020-07-01 MED ORDER — BUPIVACAINE-EPINEPHRINE 0.25% -1:200000 IJ SOLN
INTRAMUSCULAR | Status: DC | PRN
  Administered 2020-07-01: 10 mL

## 2020-07-01 MED ORDER — ENSURE PRE-SURGERY PO LIQD: 296.0000 mL | Freq: Once | ORAL | Status: DC

## 2020-07-01 MED ORDER — FENTANYL CITRATE (PF) 250 MCG/5ML IJ SOLN
INTRAMUSCULAR | Status: AC
  Filled 2020-07-01: qty 5

## 2020-07-01 MED ORDER — FENTANYL CITRATE (PF) 100 MCG/2ML IJ SOLN
INTRAMUSCULAR | Status: AC
  Administered 2020-07-01: 50 ug via INTRAVENOUS
  Filled 2020-07-01: qty 2

## 2020-07-01 MED ORDER — KETOROLAC TROMETHAMINE 15 MG/ML IJ SOLN: 15.0000 mg | Freq: Once | INTRAMUSCULAR | Status: DC | PRN

## 2020-07-01 MED ORDER — SUGAMMADEX SODIUM 200 MG/2ML IV SOLN
INTRAVENOUS | Status: DC | PRN
  Administered 2020-07-01: 180 mg via INTRAVENOUS

## 2020-07-01 MED ORDER — ROPIVACAINE HCL 5 MG/ML IJ SOLN
INTRAMUSCULAR | Status: DC | PRN
  Administered 2020-07-01: 30 mL via PERINEURAL

## 2020-07-01 MED ORDER — ORAL CARE MOUTH RINSE: 15.0000 mL | Freq: Once | OROMUCOSAL | Status: AC

## 2020-07-01 MED ORDER — DEXAMETHASONE SODIUM PHOSPHATE 10 MG/ML IJ SOLN
INTRAMUSCULAR | Status: DC | PRN
  Administered 2020-07-01: 5 mg via INTRAVENOUS

## 2020-07-01 MED ORDER — PHENYLEPHRINE HCL-NACL 10-0.9 MG/250ML-% IV SOLN
INTRAVENOUS | Status: DC | PRN
  Administered 2020-07-01: 50 ug/min via INTRAVENOUS

## 2020-07-01 MED ORDER — 0.9 % SODIUM CHLORIDE (POUR BTL) OPTIME
TOPICAL | Status: DC | PRN
  Administered 2020-07-01: 1000 mL

## 2020-07-01 MED ORDER — ONDANSETRON HCL 4 MG/2ML IJ SOLN: 4.0000 mg | Freq: Once | INTRAMUSCULAR | Status: DC | PRN

## 2020-07-01 SURGICAL SUPPLY — 42 items
BLADE CLIPPER SURG (BLADE) ×2 IMPLANT
CANISTER SUCT 3000ML PPV (MISCELLANEOUS) ×2 IMPLANT
CHLORAPREP W/TINT 26 (MISCELLANEOUS) ×2 IMPLANT
COVER SURGICAL LIGHT HANDLE (MISCELLANEOUS) ×2 IMPLANT
COVER WAND RF STERILE (DRAPES) ×2 IMPLANT
DERMABOND ADVANCED (GAUZE/BANDAGES/DRESSINGS) ×1
DERMABOND ADVANCED .7 DNX12 (GAUZE/BANDAGES/DRESSINGS) ×1 IMPLANT
DRAIN PENROSE 1/2X12 LTX STRL (WOUND CARE) ×2 IMPLANT
DRAPE LAPAROTOMY TRNSV 102X78 (DRAPES) ×2 IMPLANT
ELECT REM PT RETURN 9FT ADLT (ELECTROSURGICAL) ×2
ELECTRODE REM PT RTRN 9FT ADLT (ELECTROSURGICAL) ×1 IMPLANT
GAUZE 4X4 16PLY RFD (DISPOSABLE) ×2 IMPLANT
GLOVE BIO SURGEON STRL SZ7.5 (GLOVE) ×2 IMPLANT
GLOVE BIOGEL PI IND STRL 8 (GLOVE) ×1 IMPLANT
GLOVE BIOGEL PI INDICATOR 8 (GLOVE) ×1
GOWN STRL REUS W/ TWL LRG LVL3 (GOWN DISPOSABLE) ×1 IMPLANT
GOWN STRL REUS W/ TWL XL LVL3 (GOWN DISPOSABLE) ×1 IMPLANT
GOWN STRL REUS W/TWL LRG LVL3 (GOWN DISPOSABLE) ×1
GOWN STRL REUS W/TWL XL LVL3 (GOWN DISPOSABLE) ×1
KIT BASIN OR (CUSTOM PROCEDURE TRAY) ×2 IMPLANT
KIT TURNOVER KIT B (KITS) ×2 IMPLANT
MESH PARIETEX PROGRIP RIGHT (Mesh General) ×2 IMPLANT
NEEDLE HYPO 25GX1X1/2 BEV (NEEDLE) ×2 IMPLANT
NS IRRIG 1000ML POUR BTL (IV SOLUTION) ×2 IMPLANT
PACK GENERAL/GYN (CUSTOM PROCEDURE TRAY) ×2 IMPLANT
PAD ARMBOARD 7.5X6 YLW CONV (MISCELLANEOUS) ×4 IMPLANT
PENCIL SMOKE EVACUATOR (MISCELLANEOUS) ×2 IMPLANT
SPONGE INTESTINAL PEANUT (DISPOSABLE) IMPLANT
SUT MNCRL AB 4-0 PS2 18 (SUTURE) ×2 IMPLANT
SUT PROLENE 2 0 SH DA (SUTURE) ×2 IMPLANT
SUT VIC AB 2-0 SH 27 (SUTURE) ×3
SUT VIC AB 2-0 SH 27X BRD (SUTURE) ×3 IMPLANT
SUT VIC AB 3-0 SH 27 (SUTURE) ×2
SUT VIC AB 3-0 SH 27XBRD (SUTURE) ×2 IMPLANT
SUT VICRYL AB 2 0 TIES (SUTURE) ×2 IMPLANT
SYR BULB IRRIG 60ML STRL (SYRINGE) ×2 IMPLANT
SYR CONTROL 10ML LL (SYRINGE) ×2 IMPLANT
SYRINGE TOOMEY DISP (SYRINGE) ×2 IMPLANT
TOWEL GREEN STERILE (TOWEL DISPOSABLE) ×2 IMPLANT
TOWEL GREEN STERILE FF (TOWEL DISPOSABLE) ×2 IMPLANT
TRAY FOL W/BAG SLVR 16FR STRL (SET/KITS/TRAYS/PACK) ×1 IMPLANT
TRAY FOLEY W/BAG SLVR 16FR LF (SET/KITS/TRAYS/PACK) ×1

## 2020-07-01 NOTE — H&P (Signed)
History of Present Illness  The patient is a 74 year old male who presents with an inguinal hernia. Chief Complaint: Right inguinal hernia  Patient is a 74 year old male who comes in secondary to a pain and bulge in the right inguinal area. Patient has a history of A. fib, on Xarelto, hypertension, has had multiple abdominal surgeries. Patient states that he previously noticed a pain in the right inguinal area less to 3 months. He states that recently while lifting rotors he had noticeable as the right inguinal area. He states that since that time is been minimizing his lifting. He states that the bulge does reduce on its own.  Patient has a lower midline incision for prostatectomy, McBurney's incision as well as right paramedian incision for appendectomy and internal hemorrhage postop.     Past Surgical History Appendectomy  Cataract Surgery  Right. Colon Polyp Removal - Colonoscopy  Knee Surgery  Bilateral. Open Inguinal Hernia Surgery  Left. Oral Surgery  Prostate Surgery - Removal  Shoulder Surgery  Right.  Diagnostic Studies History  Colonoscopy  1-5 years ago  Allergies No Known Drug Allergies  [05/20/2020]: Allergies Reconciled   Medication History Bisoprolol Fumarate (5MG  Tablet, Oral) Active. Cartia XT (120MG  Capsule ER 24HR, Oral) Active. Digoxin (125MCG Tablet, Oral) Active. Potassium Gluconate (550MG  Tablet, Oral) Active. Xarelto (20MG  Tablet, Oral) Active. Medications Reconciled  Social History Alcohol use  Occasional alcohol use. Caffeine use  Coffee, Tea. No drug use  Tobacco use  Never smoker.  Family History  Arthritis  Mother.  Other Problems  Atrial Fibrillation  Enlarged Prostate  High blood pressure  Hypercholesterolemia  Inguinal Hernia  Kidney Stone  Prostate Cancer     Review of Systems  General Not Present- Appetite Loss, Chills, Fatigue, Fever, Night Sweats, Weight Gain and Weight  Loss. Skin Not Present- Change in Wart/Mole, Dryness, Hives, Jaundice, New Lesions, Non-Healing Wounds, Rash and Ulcer. HEENT Present- Wears glasses/contact lenses. Not Present- Earache, Hearing Loss, Hoarseness, Nose Bleed, Oral Ulcers, Ringing in the Ears, Seasonal Allergies, Sinus Pain, Sore Throat, Visual Disturbances and Yellow Eyes. Respiratory Not Present- Bloody sputum, Chronic Cough, Difficulty Breathing, Snoring and Wheezing. Breast Not Present- Breast Mass, Breast Pain, Nipple Discharge and Skin Changes. Cardiovascular Present- Leg Cramps and Shortness of Breath. Not Present- Chest Pain, Difficulty Breathing Lying Down, Palpitations, Rapid Heart Rate and Swelling of Extremities. Gastrointestinal Not Present- Abdominal Pain, Bloating, Bloody Stool, Change in Bowel Habits, Chronic diarrhea, Constipation, Difficulty Swallowing, Excessive gas, Gets full quickly at meals, Hemorrhoids, Indigestion, Nausea, Rectal Pain and Vomiting. Male Genitourinary Present- Frequency. Not Present- Blood in Urine, Change in Urinary Stream, Impotence, Nocturia, Painful Urination, Urgency and Urine Leakage. Musculoskeletal Present- Muscle Weakness. Not Present- Back Pain, Joint Pain, Joint Stiffness, Muscle Pain and Swelling of Extremities. Neurological Not Present- Decreased Memory, Fainting, Headaches, Numbness, Seizures, Tingling, Tremor, Trouble walking and Weakness. Psychiatric Not Present- Anxiety, Bipolar, Change in Sleep Pattern, Depression, Fearful and Frequent crying. Endocrine Present- Hot flashes. Not Present- Cold Intolerance, Excessive Hunger, Hair Changes, Heat Intolerance and New Diabetes. Hematology Not Present- Blood Thinners, Easy Bruising, Excessive bleeding, Gland problems, HIV and Persistent Infections. All other systems negative  BP (!) 159/97    Pulse 61    Temp 98.1 F (36.7 C) (Oral)    Resp 17    Ht 6\' 2"  (1.88 m)    Wt 86.9 kg    SpO2 95%    BMI 24.60 kg/m    Physical Exam  The  physical exam findings are as  follows: Note: Constitutional: No acute distress, conversant, appears stated age  Eyes: Anicteric sclerae, moist conjunctiva, no lid lag  Neck: No thyromegaly, trachea midline, no cervical lymphadenopathy  Lungs: Clear to auscultation biilaterally, normal respiratory effot  Cardiovascular: regular rate & rhythm, no murmurs, no peripheal edema, pedal pulses 2+  GI: Soft, no masses or hepatosplenomegaly, non-tender to palpation  MSK: Normal gait, no clubbing cyanosis, edema  Skin: No rashes, palpation reveals normal skin turgor  Psychiatric: Appropriate judgment and insight, oriented to person, place, and time  Abdomen Inspection Hernias - Right - Inguinal hernia - Reducible - Right.    Assessment & Plan RIGHT INGUINAL HERNIA (K40.90) Impression: 74 year old male with a right inguinal hernia  Patient on also, with a history of A. fib. We'll get clearance by Dr. Sharen Hint his cardiologist. 1. The patient will like to proceed to the operating room for open right inguinal hernia repair with mesh.  2. I discussed with the patient the signs and symptoms of incarceration and strangulation and the need to proceed to the ER should they occur.  3. I discussed with the patient the risks and benefits of the procedure to include but not limited to: Infection, bleeding, damage to surrounding structures, possible need for further surgery, possible nerve pain, and possible recurrence. The patient was understanding and wishes to proceed.

## 2020-07-01 NOTE — Anesthesia Procedure Notes (Signed)
Anesthesia Regional Block: TAP block   Pre-Anesthetic Checklist: ,, timeout performed, Correct Patient, Correct Site, Correct Laterality, Correct Procedure, Correct Position, site marked, Risks and benefits discussed,  Surgical consent,  Pre-op evaluation,  At surgeon's request and post-op pain management  Laterality: Right  Prep: chloraprep       Needles:  Injection technique: Single-shot  Needle Type: Echogenic Stimulator Needle     Needle Length: 10cm  Needle Gauge: 20     Additional Needles:   Procedures:,,,, ultrasound used (permanent image in chart),,,,  Narrative:  Start time: 07/01/2020 9:15 AM End time: 07/01/2020 9:30 AM Injection made incrementally with aspirations every 5 mL.  Performed by: Personally  Anesthesiologist: Murvin Natal, MD  Additional Notes: Functioning IV was confirmed and monitors were applied.  A timeout was performed. Sterile prep, hand hygiene and sterile gloves were used. A 169mm 20ga BBraun echogenic stimulator needle was used. Negative aspiration and negative test dose prior to incremental administration of local anesthetic. The patient tolerated the procedure well.  Ultrasound guidance: relevent anatomy identified, needle position confirmed, local anesthetic spread visualized around nerve(s), vascular puncture avoided.  Image printed for medical record.

## 2020-07-01 NOTE — Op Note (Signed)
07/01/2020  10:37 AM  PATIENT:  Ronnie Booth  74 y.o. male  PRE-OPERATIVE DIAGNOSIS:  RGHT INGUINAL HERNIA  POST-OPERATIVE DIAGNOSIS:  right Direct inguinal hernia  PROCEDURE:  Procedure(s): OPEN RIGHT INGUINAL HERNIA REPAIR WITH MESH (Right)  SURGEON:  Surgeon(s) and Role:    Ralene Ok, MD - Primary  PHYSICIAN ASSISTANT: Pryor Curia, RNFA  ANESTHESIA:   local and general  EBL:  minimal   BLOOD ADMINISTERED:none  DRAINS: none   LOCAL MEDICATIONS USED:  BUPIVICAINE   SPECIMEN:  No Specimen  DISPOSITION OF SPECIMEN:  N/A  COUNTS:  YES  TOURNIQUET:  * No tourniquets in log *  DICTATION: .Dragon Dictation Details of the procedure: The patient was taken back to the operating room. The patient was placed in supine position with bilateral SCDs in place. The patient was prepped and draped in the usual sterile fashion.  After appropriate anitbiotics were confirmed, a time-out was confirmed and all facts were verified.  Quarter percent Marcaine was used to infiltrate the area of the incision and an ilioinguinal nerve block was also placed.   A 5 cm incision was made just 1 cm superior to the inguinal ligament. Bovie cautery was used to maintain hemostasis dissection is carried down to the external oblique.  A standard incision was made laterally, and the external oblique was bluntly dissected away from the surrounding tissue with Metzenbaum scissors. The external oblique was elevated in the spermatic cord was bluntly dissected away from the surrounding tissue.  The ilioinguinal nerve was identified and ligated with an 2-0 dyed vicryl.   The spermatic cord and the hernia were then bluntly dissected away from the pubic tubercle and a Penrose was placed around the hernia sac in the spermatic cord. The vas deferens was identified and protected at all portions of the case. Dissection of the cremasterics took place with Bovie cautery. One the hernia sac was dissected  away from the surrrounding cremesteric tissue , the hernia sac seen to be in the direct space.  It was reduced and  A 2-0 vicryl was used to tuck the hernia intraabdominal at the internal ring. At this time a right-sided Progrip mesh was then anchored to the pubic tubercle with a 2-0 Prolene.  It was anchored to the shelving edge of the external oblique x 1 and the conjoint tendon cephalad x 1.  The wrap around of the mesh was sutured to the conjoint tendon as well.  The new internal ring did not strangulate the spermatic cord.   The tail was then tucked under the external oblique. At this time the area was irrigated out with sterile saline.    The external oblique was reapproximated using a 2-0 Vicryl in a running fashion. Scarpa's fascia was then reapproximated using a 3-0 Vicryl running fashion. The skin was then reapproximated with 4 Monocryl in a subcuticular fashion. The skin was then dressed with Dermabond.  The patient was taken to the recovery room in stable condition.      PLAN OF CARE: Discharge to home after PACU  PATIENT DISPOSITION:  PACU - hemodynamically stable.   Delay start of Pharmacological VTE agent (>24hrs) due to surgical blood loss or risk of bleeding: not applicable

## 2020-07-01 NOTE — Transfer of Care (Signed)
Immediate Anesthesia Transfer of Care Note  Patient: Ronnie Booth  Procedure(s) Performed: OPEN RIGHT INGUINAL HERNIA REPAIR WITH MESH (Right Inguinal)  Patient Location: PACU  Anesthesia Type:General  Level of Consciousness: awake, alert  and oriented  Airway & Oxygen Therapy: Patient Spontanous Breathing  Post-op Assessment: Report given to RN, Post -op Vital signs reviewed and stable and Patient moving all extremities  Post vital signs: Reviewed and stable  Last Vitals:  Vitals Value Taken Time  BP 142/82 07/01/20 1053  Temp 36.5 C 07/01/20 1053  Pulse 55 07/01/20 1054  Resp 12 07/01/20 1054  SpO2 95 % 07/01/20 1054  Vitals shown include unvalidated device data.  Last Pain:  Vitals:   07/01/20 0934  TempSrc:   PainSc: 0-No pain         Complications: No complications documented.

## 2020-07-01 NOTE — Anesthesia Procedure Notes (Signed)
Procedure Name: Intubation Date/Time: 07/01/2020 9:47 AM Performed by: Amadeo Garnet, CRNA Pre-anesthesia Checklist: Patient identified, Emergency Drugs available, Suction available and Patient being monitored Patient Re-evaluated:Patient Re-evaluated prior to induction Oxygen Delivery Method: Circle system utilized Preoxygenation: Pre-oxygenation with 100% oxygen Induction Type: IV induction Ventilation: Mask ventilation without difficulty and Oral airway inserted - appropriate to patient size Laryngoscope Size: Mac and 4 Grade View: Grade I Tube type: Oral Tube size: 7.5 mm Number of attempts: 1 Airway Equipment and Method: Stylet and Oral airway Placement Confirmation: ETT inserted through vocal cords under direct vision,  positive ETCO2 and breath sounds checked- equal and bilateral Secured at: 23 cm Tube secured with: Tape Dental Injury: Teeth and Oropharynx as per pre-operative assessment

## 2020-07-01 NOTE — Anesthesia Postprocedure Evaluation (Signed)
Anesthesia Post Note  Patient: Ronnie Booth  Procedure(s) Performed: OPEN RIGHT INGUINAL HERNIA REPAIR WITH MESH (Right Inguinal)     Patient location during evaluation: PACU Anesthesia Type: Regional and General Level of consciousness: awake Pain management: pain level controlled Vital Signs Assessment: post-procedure vital signs reviewed and stable Respiratory status: spontaneous breathing, nonlabored ventilation, respiratory function stable and patient connected to nasal cannula oxygen Cardiovascular status: blood pressure returned to baseline and stable Postop Assessment: no apparent nausea or vomiting Anesthetic complications: no   No complications documented.  Last Vitals:  Vitals:   07/01/20 1108 07/01/20 1123  BP: 115/79 (!) 143/87  Pulse: (!) 52 (!) 51  Resp: 14 15  Temp:  36.5 C  SpO2: 98% 98%    Last Pain:  Vitals:   07/01/20 1123  TempSrc:   PainSc: 0-No pain                 Jahson Emanuele P Coby Antrobus

## 2020-07-01 NOTE — Discharge Instructions (Signed)
[facility] _______[facility] °INGUINAL HERNIA REPAIR: POST OP INSTRUCTIONS ° °Always review your discharge instruction sheet given to you by the facility where your surgery was performed. °IF YOU HAVE DISABILITY OR FAMILY LEAVE FORMS, YOU MUST BRING THEM TO THE OFFICE FOR PROCESSING.   °DO NOT GIVE THEM TO YOUR DOCTOR. ° °1. A  prescription for pain medication may be given to you upon discharge.  Take your pain medication as prescribed, if needed.  If narcotic pain medicine is not needed, then you may take acetaminophen (Tylenol) or ibuprofen (Advil) as needed. °2. Take your usually prescribed medications unless otherwise directed. °If you need a refill on your pain medication, please contact your pharmacy.  They will contact our office to request authorization. Prescriptions will not be filled after 5 pm or on week-ends. °3. You should follow a light diet the first 24 hours after arrival home, such as soup and crackers, etc.  Be sure to include lots of fluids daily.  Resume your normal diet the day after surgery. °4.Most patients will experience some swelling and bruising around the umbilicus or in the groin and scrotum.  Ice packs and reclining will help.  Swelling and bruising can take several days to resolve.  °6. It is common to experience some constipation if taking pain medication after surgery.  Increasing fluid intake and taking a stool softener (such as Colace) will usually help or prevent this problem from occurring.  A mild laxative (Milk of Magnesia or Miralax) should be taken according to package directions if there are no bowel movements after 48 hours. °7. Unless discharge instructions indicate otherwise, you may remove your bandages 24-48 hours after surgery, and you may shower at that time.  You may have steri-strips (small skin tapes) in place directly over the incision.  These strips should be left on the skin for 7-10 days.  If your surgeon used skin glue on the incision, you may  shower in 24 hours.  The glue will flake off over the next 2-3 weeks.  Any sutures or staples will be removed at the office during your follow-up visit. °8. ACTIVITIES:  You may resume regular (light) daily activities beginning the next day--such as daily self-care, walking, climbing stairs--gradually increasing activities as tolerated.  You may have sexual intercourse when it is comfortable.  Refrain from any heavy lifting or straining until approved by your doctor. ° °a.You may drive when you are no longer taking prescription pain medication, you can comfortably wear a seatbelt, and you can safely maneuver your car and apply brakes. °b.RETURN TO WORK:   °_____________________________________________ ° °9.You should see your doctor in the office for a follow-up appointment approximately 2-3 weeks after your surgery.  Make sure that you call for this appointment within a day or two after you arrive home to insure a convenient appointment time. °10.OTHER INSTRUCTIONS: _________________________ °   _____________________________________ ° °WHEN TO CALL YOUR DOCTOR: °1. Fever over 101.0 °2. Inability to urinate °3. Nausea and/or vomiting °4. Extreme swelling or bruising °5. Continued bleeding from incision. °6. Increased pain, redness, or drainage from the incision ° °The clinic staff is available to answer your questions during regular business hours.  Please don’t hesitate to call and ask to speak to one of the nurses for clinical concerns.  If you have a medical emergency, go to the nearest emergency room or call 911.  A surgeon from [facility] is always on call at the hospital ° ° °[address], [address], [place], [place]  [address] ? ° [address] [address], [place], [place]   [address] °[phone] ? [phone] ? FAX [phone] °Web site: [email or web address] ° °

## 2020-07-02 ENCOUNTER — Encounter (HOSPITAL_COMMUNITY): Payer: Self-pay | Admitting: General Surgery

## 2020-07-14 DIAGNOSIS — C61 Malignant neoplasm of prostate: Secondary | ICD-10-CM | POA: Diagnosis not present

## 2020-07-19 DIAGNOSIS — H17822 Peripheral opacity of cornea, left eye: Secondary | ICD-10-CM | POA: Diagnosis not present

## 2020-07-19 DIAGNOSIS — H43813 Vitreous degeneration, bilateral: Secondary | ICD-10-CM | POA: Diagnosis not present

## 2020-07-19 DIAGNOSIS — H2513 Age-related nuclear cataract, bilateral: Secondary | ICD-10-CM | POA: Diagnosis not present

## 2020-07-22 DIAGNOSIS — C61 Malignant neoplasm of prostate: Secondary | ICD-10-CM | POA: Diagnosis not present

## 2020-07-22 DIAGNOSIS — N200 Calculus of kidney: Secondary | ICD-10-CM | POA: Diagnosis not present

## 2020-07-22 DIAGNOSIS — R351 Nocturia: Secondary | ICD-10-CM | POA: Diagnosis not present

## 2020-07-22 DIAGNOSIS — C775 Secondary and unspecified malignant neoplasm of intrapelvic lymph nodes: Secondary | ICD-10-CM | POA: Diagnosis not present

## 2020-07-25 ENCOUNTER — Other Ambulatory Visit: Payer: Self-pay | Admitting: Cardiology

## 2020-07-27 DIAGNOSIS — L309 Dermatitis, unspecified: Secondary | ICD-10-CM | POA: Diagnosis not present

## 2020-08-18 DIAGNOSIS — L409 Psoriasis, unspecified: Secondary | ICD-10-CM | POA: Diagnosis not present

## 2020-08-18 DIAGNOSIS — L538 Other specified erythematous conditions: Secondary | ICD-10-CM | POA: Diagnosis not present

## 2020-08-18 DIAGNOSIS — R21 Rash and other nonspecific skin eruption: Secondary | ICD-10-CM | POA: Diagnosis not present

## 2020-08-18 DIAGNOSIS — L300 Nummular dermatitis: Secondary | ICD-10-CM | POA: Diagnosis not present

## 2020-08-23 ENCOUNTER — Encounter (HOSPITAL_COMMUNITY): Payer: Self-pay

## 2020-08-23 ENCOUNTER — Emergency Department (HOSPITAL_COMMUNITY)
Admission: EM | Admit: 2020-08-23 | Discharge: 2020-08-23 | Disposition: A | Discharge status: Home / Self Care | Payer: Medicare Other | Attending: Emergency Medicine | Admitting: Emergency Medicine

## 2020-08-23 ENCOUNTER — Other Ambulatory Visit: Payer: Self-pay

## 2020-08-23 ENCOUNTER — Emergency Department (HOSPITAL_COMMUNITY): Payer: Medicare Other

## 2020-08-23 DIAGNOSIS — M546 Pain in thoracic spine: Secondary | ICD-10-CM | POA: Insufficient documentation

## 2020-08-23 DIAGNOSIS — S299XXA Unspecified injury of thorax, initial encounter: Secondary | ICD-10-CM | POA: Diagnosis not present

## 2020-08-23 DIAGNOSIS — S42101A Fracture of unspecified part of scapula, right shoulder, initial encounter for closed fracture: Secondary | ICD-10-CM | POA: Diagnosis not present

## 2020-08-23 DIAGNOSIS — S4991XA Unspecified injury of right shoulder and upper arm, initial encounter: Secondary | ICD-10-CM | POA: Diagnosis not present

## 2020-08-23 DIAGNOSIS — Z5321 Procedure and treatment not carried out due to patient leaving prior to being seen by health care provider: Secondary | ICD-10-CM | POA: Diagnosis not present

## 2020-08-23 DIAGNOSIS — M25511 Pain in right shoulder: Secondary | ICD-10-CM | POA: Insufficient documentation

## 2020-08-23 DIAGNOSIS — W208XXA Other cause of strike by thrown, projected or falling object, initial encounter: Secondary | ICD-10-CM | POA: Diagnosis not present
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
## 2020-08-23 NOTE — ED Notes (Signed)
Pt left AMA °

## 2020-08-25 DIAGNOSIS — S42111A Displaced fracture of body of scapula, right shoulder, initial encounter for closed fracture: Secondary | ICD-10-CM | POA: Diagnosis not present

## 2020-08-25 DIAGNOSIS — M545 Low back pain, unspecified: Secondary | ICD-10-CM | POA: Diagnosis not present

## 2020-08-28 ENCOUNTER — Other Ambulatory Visit: Payer: Self-pay | Admitting: Cardiology

## 2020-08-30 DIAGNOSIS — L400 Psoriasis vulgaris: Secondary | ICD-10-CM | POA: Diagnosis not present

## 2020-09-08 DIAGNOSIS — M545 Low back pain, unspecified: Secondary | ICD-10-CM | POA: Diagnosis not present

## 2020-09-13 DIAGNOSIS — S42111D Displaced fracture of body of scapula, right shoulder, subsequent encounter for fracture with routine healing: Secondary | ICD-10-CM | POA: Diagnosis not present

## 2020-09-13 DIAGNOSIS — M545 Low back pain, unspecified: Secondary | ICD-10-CM | POA: Diagnosis not present

## 2020-10-11 DIAGNOSIS — S42111D Displaced fracture of body of scapula, right shoulder, subsequent encounter for fracture with routine healing: Secondary | ICD-10-CM | POA: Diagnosis not present

## 2020-10-11 DIAGNOSIS — M545 Low back pain, unspecified: Secondary | ICD-10-CM | POA: Diagnosis not present

## 2020-10-27 DIAGNOSIS — I10 Essential (primary) hypertension: Secondary | ICD-10-CM | POA: Diagnosis not present

## 2020-10-27 DIAGNOSIS — I4891 Unspecified atrial fibrillation: Secondary | ICD-10-CM | POA: Diagnosis not present

## 2020-10-27 DIAGNOSIS — Z0000 Encounter for general adult medical examination without abnormal findings: Secondary | ICD-10-CM | POA: Diagnosis not present

## 2020-10-27 DIAGNOSIS — E785 Hyperlipidemia, unspecified: Secondary | ICD-10-CM | POA: Diagnosis not present

## 2020-11-05 IMAGING — DX PORTABLE RIGHT KNEE - 1-2 VIEW
2 series · 2 of 2 positions shown · non-contrast
Comparison: None.

CLINICAL DATA: Status post total right knee arthroplasty.

EXAM:
PORTABLE RIGHT KNEE - 1-2 VIEW

[knee ap]
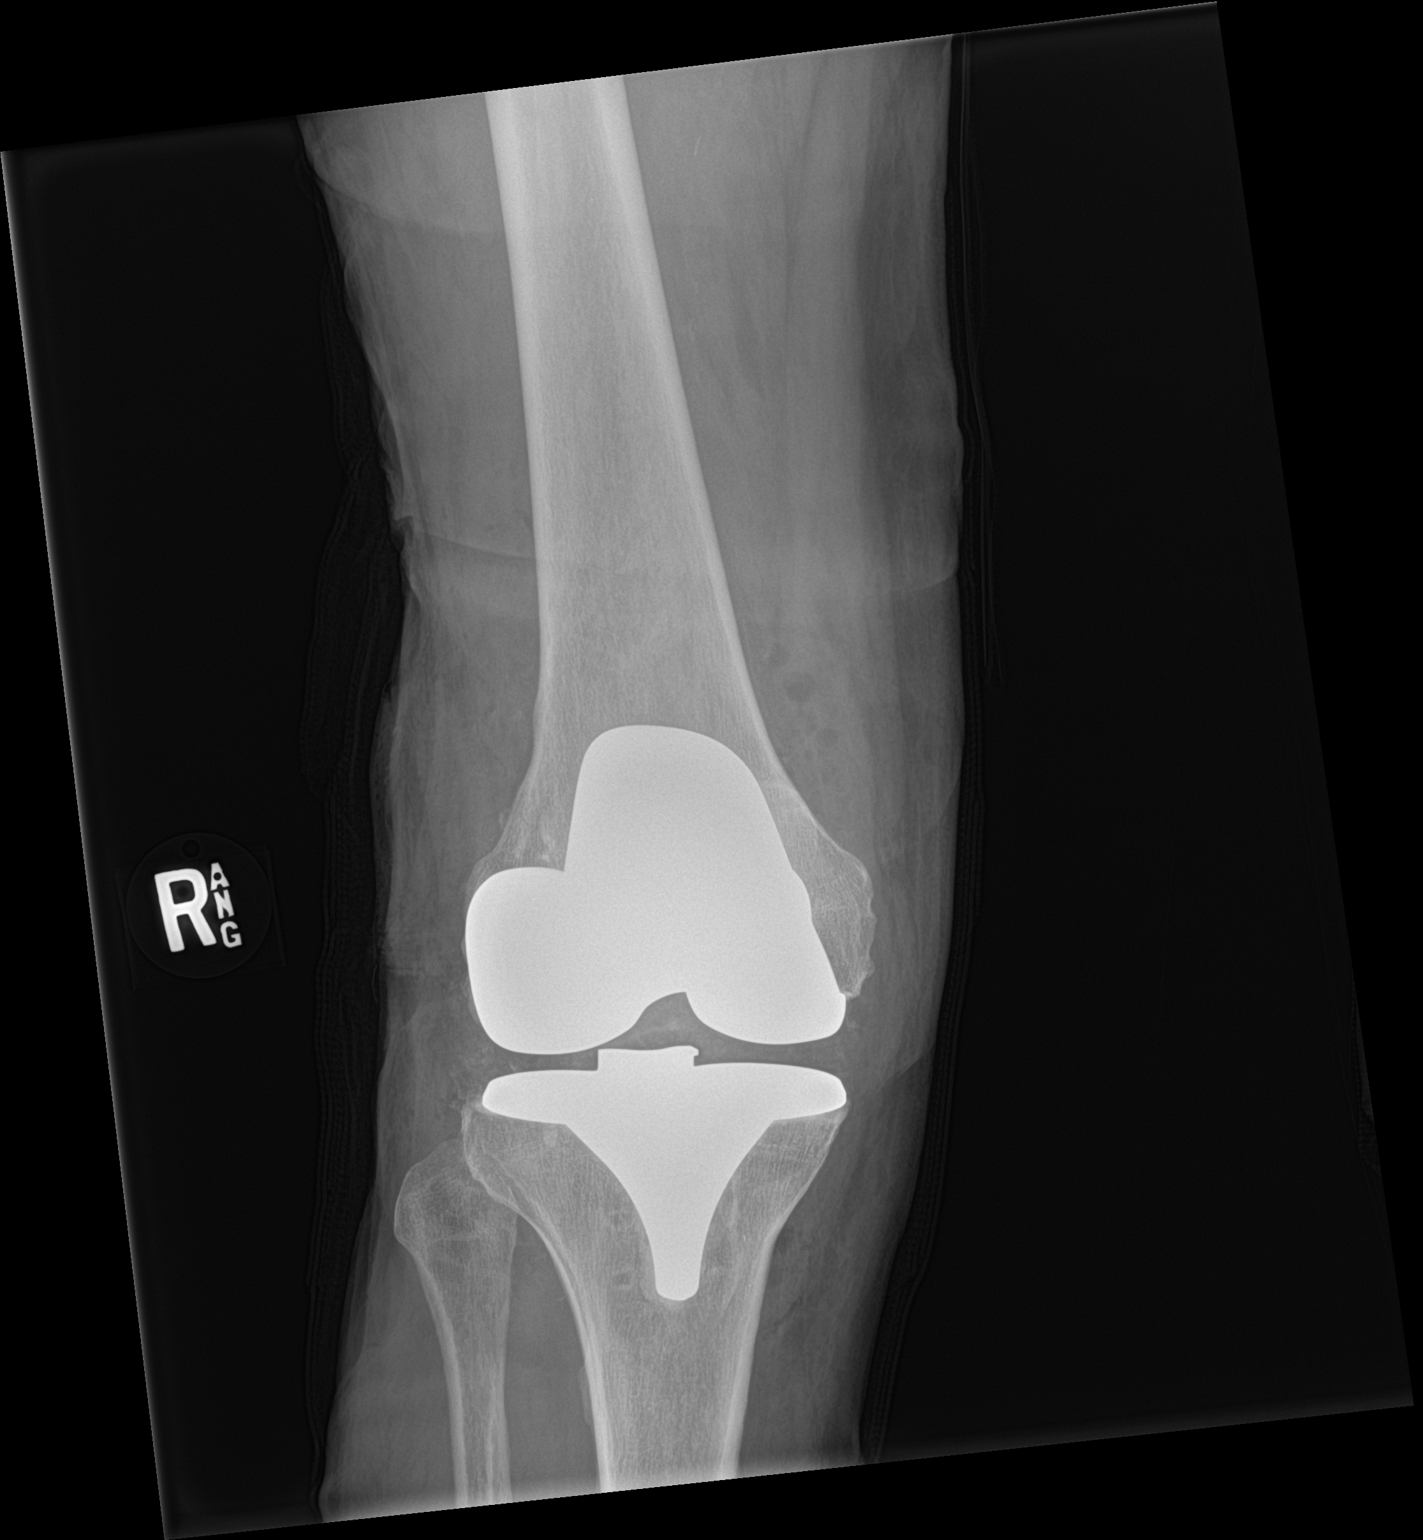

[knee lat]
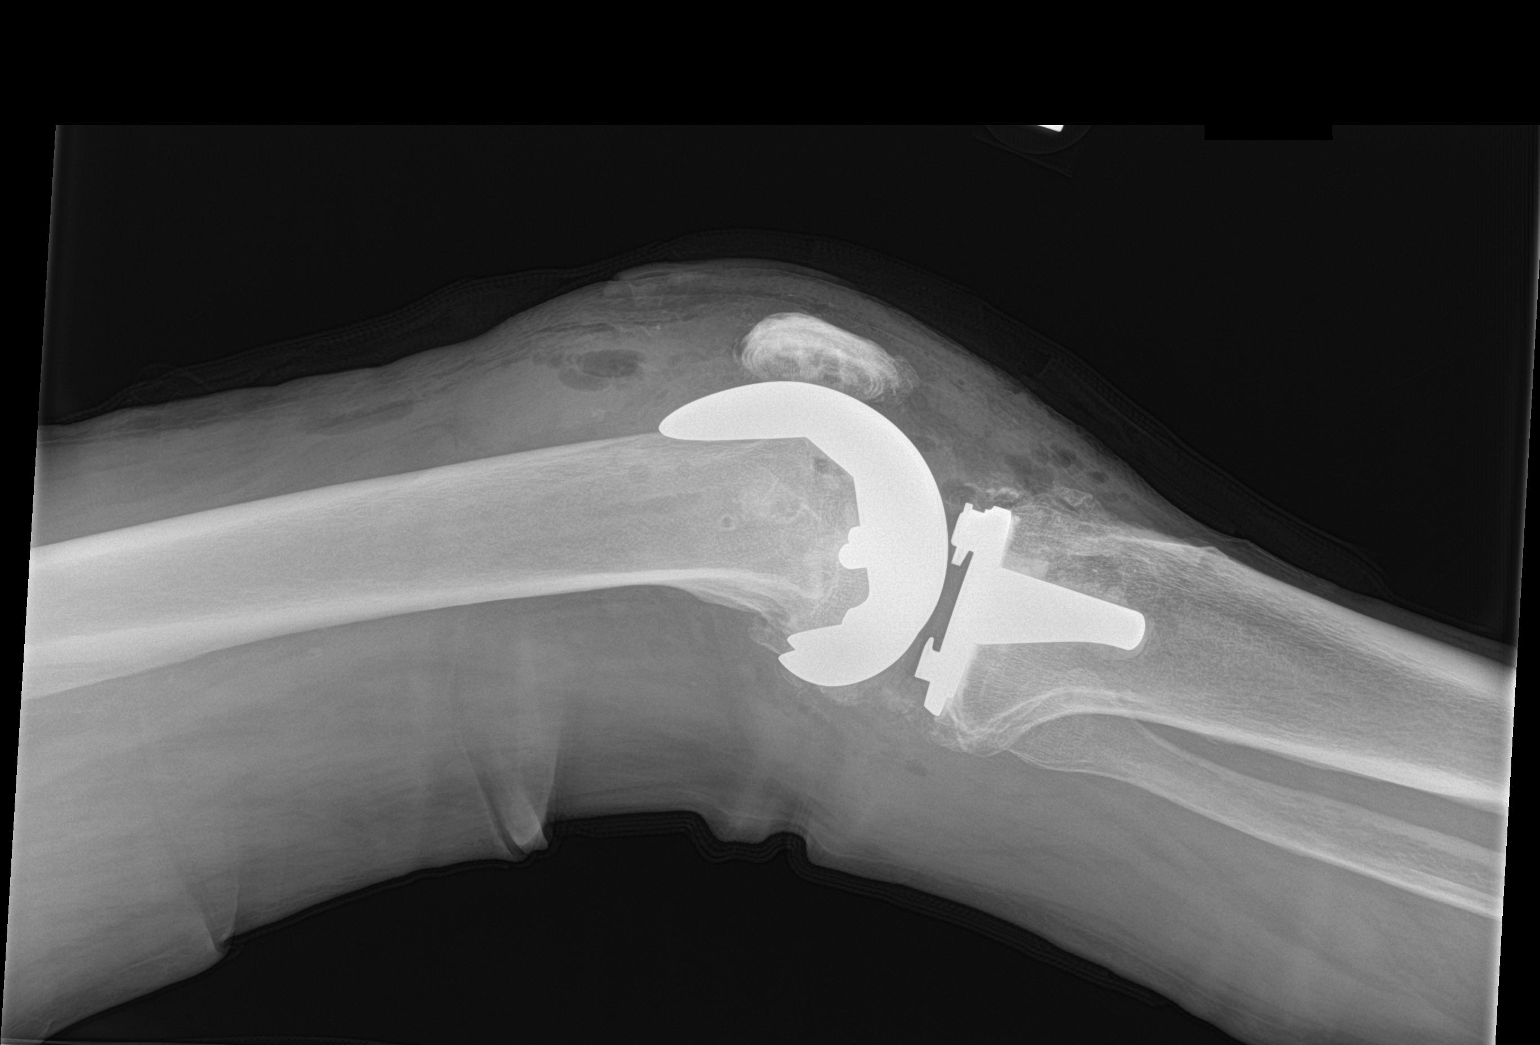

[2 of 2 positions shown; findings below may reference images not displayed]

FINDINGS: Left knee prosthetic components appear well seated and well aligned.
There is no acute fracture or evidence of an operative complication.
IMPRESSION: Well-positioned total right knee arthroplasty.

## 2020-11-11 DIAGNOSIS — I4891 Unspecified atrial fibrillation: Secondary | ICD-10-CM | POA: Diagnosis not present

## 2020-11-11 DIAGNOSIS — M179 Osteoarthritis of knee, unspecified: Secondary | ICD-10-CM | POA: Diagnosis not present

## 2020-11-11 DIAGNOSIS — I10 Essential (primary) hypertension: Secondary | ICD-10-CM | POA: Diagnosis not present

## 2020-11-11 DIAGNOSIS — E785 Hyperlipidemia, unspecified: Secondary | ICD-10-CM | POA: Diagnosis not present

## 2020-11-13 ENCOUNTER — Other Ambulatory Visit: Payer: Self-pay | Admitting: Cardiology

## 2020-11-15 DIAGNOSIS — R300 Dysuria: Secondary | ICD-10-CM | POA: Diagnosis not present

## 2020-11-15 DIAGNOSIS — R319 Hematuria, unspecified: Secondary | ICD-10-CM | POA: Diagnosis not present

## 2020-11-16 DIAGNOSIS — R310 Gross hematuria: Secondary | ICD-10-CM | POA: Diagnosis not present

## 2020-11-30 DIAGNOSIS — Z8546 Personal history of malignant neoplasm of prostate: Secondary | ICD-10-CM | POA: Diagnosis not present

## 2020-11-30 DIAGNOSIS — R310 Gross hematuria: Secondary | ICD-10-CM | POA: Diagnosis not present

## 2020-11-30 DIAGNOSIS — R634 Abnormal weight loss: Secondary | ICD-10-CM | POA: Diagnosis not present

## 2020-11-30 DIAGNOSIS — N200 Calculus of kidney: Secondary | ICD-10-CM | POA: Diagnosis not present

## 2020-12-10 DIAGNOSIS — R310 Gross hematuria: Secondary | ICD-10-CM | POA: Diagnosis not present

## 2020-12-10 DIAGNOSIS — C775 Secondary and unspecified malignant neoplasm of intrapelvic lymph nodes: Secondary | ICD-10-CM | POA: Diagnosis not present

## 2020-12-10 DIAGNOSIS — C61 Malignant neoplasm of prostate: Secondary | ICD-10-CM | POA: Diagnosis not present

## 2020-12-10 DIAGNOSIS — N200 Calculus of kidney: Secondary | ICD-10-CM | POA: Diagnosis not present

## 2020-12-20 ENCOUNTER — Other Ambulatory Visit: Payer: Self-pay | Admitting: Urology

## 2020-12-20 ENCOUNTER — Other Ambulatory Visit (HOSPITAL_COMMUNITY): Payer: Self-pay | Admitting: Urology

## 2020-12-20 DIAGNOSIS — C61 Malignant neoplasm of prostate: Secondary | ICD-10-CM

## 2021-01-18 ENCOUNTER — Other Ambulatory Visit (HOSPITAL_COMMUNITY): Payer: Medicare Other

## 2021-01-18 ENCOUNTER — Ambulatory Visit (HOSPITAL_COMMUNITY): Payer: Medicare Other

## 2021-02-14 ENCOUNTER — Other Ambulatory Visit: Payer: Self-pay | Admitting: Cardiology

## 2021-02-14 NOTE — Telephone Encounter (Signed)
76m, 84.1kg, lovw/hochrein 01/09/20, scr 0.87 06/28/20, ccr 85.9
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
